# Patient Record
Sex: Male | Born: 1985 | Race: Black or African American | Hispanic: No | Marital: Single
Health system: Midwestern US, Community
[De-identification: ages and names within clinical notes are randomized; demographics above are authoritative.]

## PROBLEM LIST (undated history)

## (undated) HISTORY — PX: HERNIA REPAIR: SHX51

## (undated) HISTORY — PX: OTHER SURGICAL HISTORY: SHX169

---
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
## 2006-02-02 ENCOUNTER — Emergency Department (HOSPITAL_COMMUNITY): Admission: EM | Admit: 2006-02-02 | Discharge: 2006-02-02 | Payer: Self-pay | Admitting: Emergency Medicine

## 2007-01-12 ENCOUNTER — Emergency Department (HOSPITAL_COMMUNITY): Admission: EM | Admit: 2007-01-12 | Discharge: 2007-01-12 | Payer: Self-pay | Admitting: Emergency Medicine
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
## 2010-06-10 NOTE — Op Note (Signed)
Casey Andrews, Casey Andrews                 ACCOUNT NO.:  [id number]   MEDICAL RECORD NO.:  [id number]          PATIENT TYPE:  AMB   LOCATION:  DSC                          FACILITY:  [facility]   PHYSICIAN:  Prabhakar D. Pendse, M.D.DATE OF BIRTH:  [birth year]   DATE OF PROCEDURE:  04/12/2004  DATE OF DISCHARGE:                                 OPERATIVE REPORT   PREOPERATIVE DIAGNOSIS:  Right inguinal scrotal hernia, incarcerated.   POSTOPERATIVE DIAGNOSIS:  Right inguinal scrotal hernia, incarcerated.   OPERATION PERFORMED:  Repair of large, incarcerated, right inguinal scrotal  hernia.   SURGEON:  Prabhakar D. Levie Heritage, M.D.   ASSISTANT:  Leonia Corona, M.D.   ANESTHESIA:  __________   OPERATIVE PROCEDURE:  Under satisfactory general anesthesia, the patient in  supine position, abdomen and groin regions were totally prepped and draped  in the usual manner about 4 inches long.  Transverse was made in the distal  inguinal skin crease.  The skin and subcutaneous tissue incised, bleeders  individually clamped, cut, and electrocoagulated.  External ring identified,  external oblique opened.  The hernia was reduced by gentle manipulation  satisfactorily.  A reduction was accomplished.  Now a Penrose drain was  placed the spermatic cord structures and blunt and sharp dissection was  carried out to isolate the indirect inguinal hernia sac.  Large hernia sac  was now isolated up to its high point, doubly suture ligated with 2-0 silk  and excess of the sac was excised.  The testicle returned to the right  scrotal pouch.  Hernia repair was carried out in the following manner.  The  floor of the inguinal canal was repaired with 2-0 Ethibond interrupted  sutures.  Spermatic cord was placed over this repair and the external  aponeurosis was repaired with #32 wire interrupted sutures.  Satisfactory  repair was accomplished, 0.25% Marcaine with epinephrine was injected  locally for postoperative  analgesia.  Subcutaneous tissue closed with  3-0 Vicryl.  The skin closed with 4-0 Monocryl subcuticular sutures.  Steri-  Strips applied.  Appropriate dressing applied.  Throughout the procedure,  the patient's vital signs remained stable.  The patient withstood the  procedure well and was transferred to recovery room in satisfactory general  condition.      PDP/MEDQ  D:  04/12/2004  T:  04/12/2004  Job:  [id number]   cc:   Sherilyn Dacosta. Chinita Greenland, M.D.  [address]  [place]  [place] [address]  Fax: [phone]   [facility] Dept.

## 2012-04-27 ENCOUNTER — Inpatient Hospital Stay (HOSPITAL_COMMUNITY): Admission: EM | Admit: 2012-04-27 | Payer: Self-pay | Admitting: Family Medicine

## 2012-04-27 ENCOUNTER — Other Ambulatory Visit (HOSPITAL_COMMUNITY): Payer: Self-pay | Admitting: Family Medicine

## 2012-04-27 ENCOUNTER — Ambulatory Visit (HOSPITAL_COMMUNITY)
Admission: RE | Admit: 2012-04-27 | Discharge: 2012-04-27 | Disposition: A | Discharge status: Home / Self Care | Payer: PRIVATE HEALTH INSURANCE | Source: Ambulatory Visit | Attending: Family Medicine | Admitting: Family Medicine

## 2012-04-27 ENCOUNTER — Ambulatory Visit (HOSPITAL_COMMUNITY)
Admission: EM | Admit: 2012-04-27 | Discharge: 2012-04-27 | Disposition: A | Discharge status: Home / Self Care | Payer: PRIVATE HEALTH INSURANCE | Attending: Family Medicine | Admitting: Family Medicine

## 2012-04-27 DIAGNOSIS — N5082 Scrotal pain: Secondary | ICD-10-CM

## 2012-04-27 DIAGNOSIS — IMO0002 Reserved for concepts with insufficient information to code with codable children: Secondary | ICD-10-CM

## 2012-04-27 DIAGNOSIS — N509 Disorder of male genital organs, unspecified: Secondary | ICD-10-CM | POA: Diagnosis present

## 2012-04-27 DIAGNOSIS — N5089 Other specified disorders of the male genital organs: Secondary | ICD-10-CM | POA: Diagnosis not present

## 2012-04-28 ENCOUNTER — Inpatient Hospital Stay (HOSPITAL_BASED_OUTPATIENT_CLINIC_OR_DEPARTMENT_OTHER): Admission: RE | Admit: 2012-04-28 | Source: Ambulatory Visit

## 2012-04-29 ENCOUNTER — Ambulatory Visit (HOSPITAL_COMMUNITY): Admission: RE | Admit: 2012-04-29 | Source: Ambulatory Visit

## 2012-04-29 ENCOUNTER — Inpatient Hospital Stay (HOSPITAL_COMMUNITY): Admission: RE | Admit: 2012-04-29 | Source: Ambulatory Visit

## 2012-11-29 ENCOUNTER — Emergency Department (HOSPITAL_COMMUNITY)
Admission: EM | Admit: 2012-11-29 | Discharge: 2012-11-29 | Disposition: A | Discharge status: Home / Self Care | Attending: Emergency Medicine | Admitting: Emergency Medicine

## 2012-11-29 ENCOUNTER — Encounter (HOSPITAL_COMMUNITY): Payer: Self-pay | Admitting: Emergency Medicine

## 2012-11-29 ENCOUNTER — Emergency Department (HOSPITAL_COMMUNITY)
Admission: EM | Admit: 2012-11-29 | Discharge: 2012-11-29 | Discharge status: Against Medical Advice | Attending: Emergency Medicine | Admitting: Emergency Medicine

## 2012-11-29 DIAGNOSIS — F17200 Nicotine dependence, unspecified, uncomplicated: Secondary | ICD-10-CM | POA: Insufficient documentation

## 2012-11-29 DIAGNOSIS — IMO0002 Reserved for concepts with insufficient information to code with codable children: Secondary | ICD-10-CM | POA: Insufficient documentation

## 2012-11-29 DIAGNOSIS — S01501A Unspecified open wound of lip, initial encounter: Secondary | ICD-10-CM | POA: Insufficient documentation

## 2012-11-29 DIAGNOSIS — Y929 Unspecified place or not applicable: Secondary | ICD-10-CM | POA: Insufficient documentation

## 2012-11-29 DIAGNOSIS — Y9389 Activity, other specified: Secondary | ICD-10-CM | POA: Insufficient documentation

## 2012-11-29 DIAGNOSIS — S01511A Laceration without foreign body of lip, initial encounter: Secondary | ICD-10-CM

## 2012-11-29 NOTE — ED Provider Notes (Signed)
Medical screening examination/treatment/procedure(s) were performed by non-physician practitioner and as supervising physician I was immediately available for consultation/collaboration.  EKG Interpretation   None         Megan E Docherty, MD 11/29/12 2229 
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
## 2012-11-29 NOTE — ED Notes (Signed)
Patient states he just got a call that he had to go get his kid at school and that he would come back later.

## 2012-11-29 NOTE — ED Provider Notes (Signed)
Medical screening examination/treatment/procedure(s) were performed by non-physician practitioner and as supervising physician I was immediately available for consultation/collaboration.  EKG Interpretation   None         Terria Deschepper E Jessika Rothery, MD 11/29/12 2229 

## 2012-11-29 NOTE — ED Notes (Signed)
Pt presents to [facility] with c/o laceration to left lower lip. States a rock flew up and hit him in the lip while landscaping today. Pt ambulatory to room in NAD at this time.
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
## 2014-04-18 IMAGING — US US SCROTUM
1 series · 13 of 25 positions shown · non-contrast
Comparison: 10/17/2005.

CLINICAL DATA: Intermittent right testicular pain.  History of
right inguinal hernia repair in 1995.  Right scrotal swelling for 3
days.

SCROTAL ULTRASOUND
DOPPLER ULTRASOUND OF THE TESTICLES
TECHNIQUE: Complete ultrasound examination of the testicles,
epididymis, and other scrotal structures was performed.  Color and
spectral Doppler ultrasound were also utilized to evaluate blood
flow to the testicles.

[Series 1: us scrotum · 13 of 32 slices shown]
[im 1/32]
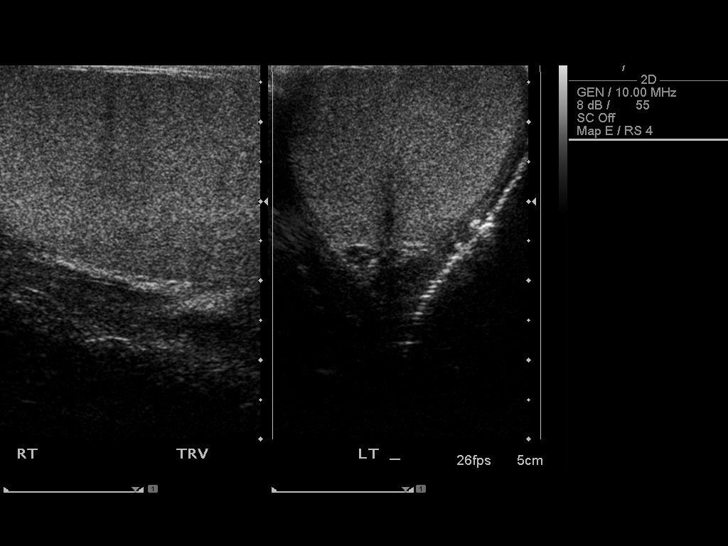
[im 3/32]
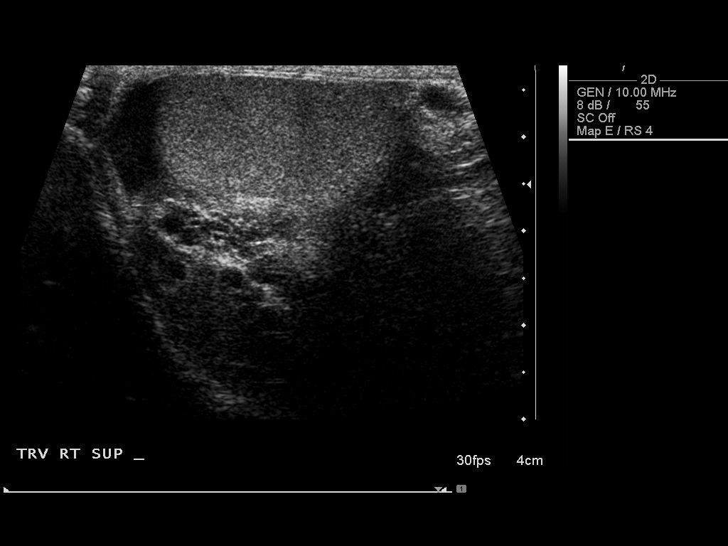
[im 6/32]
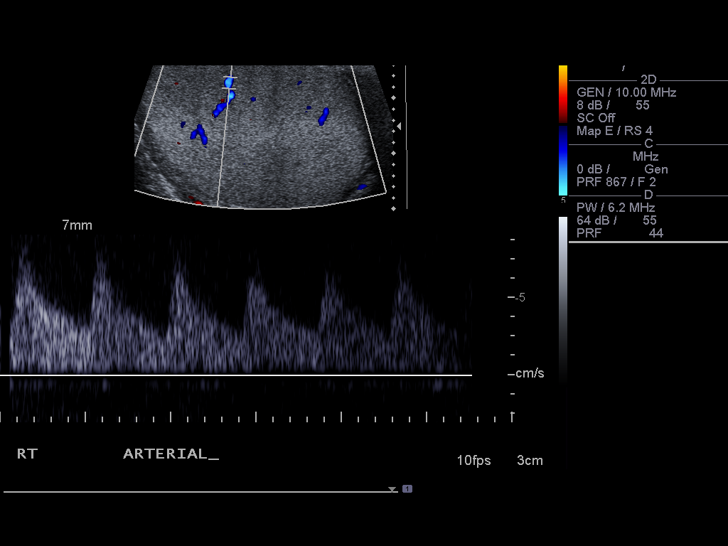
[im 8/32]
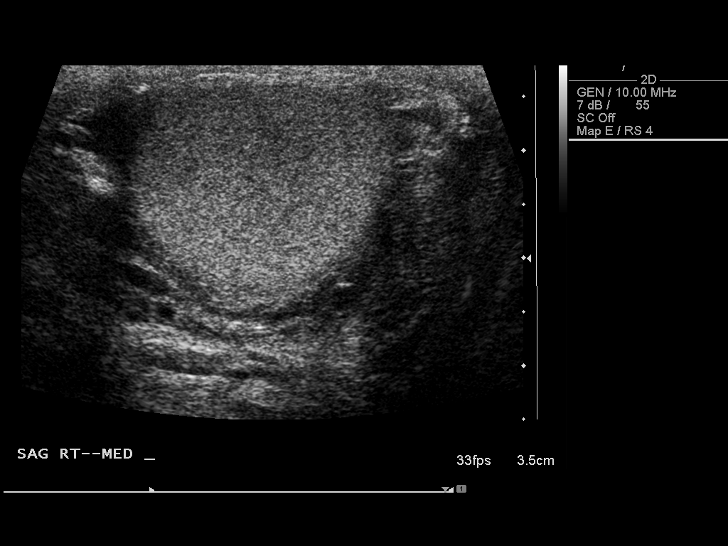
[im 11/32]
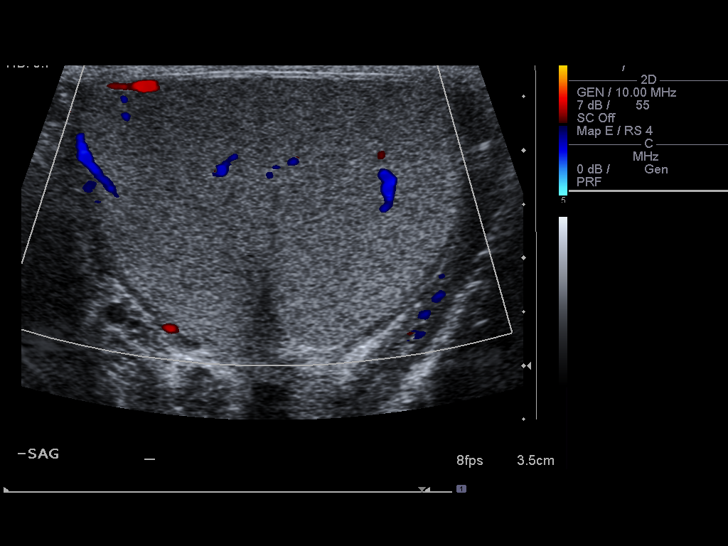
[im 13/32]
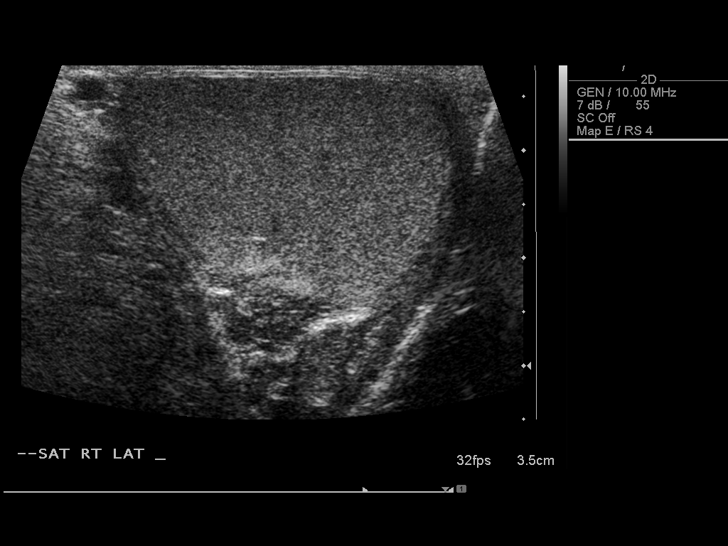
[im 16/32]
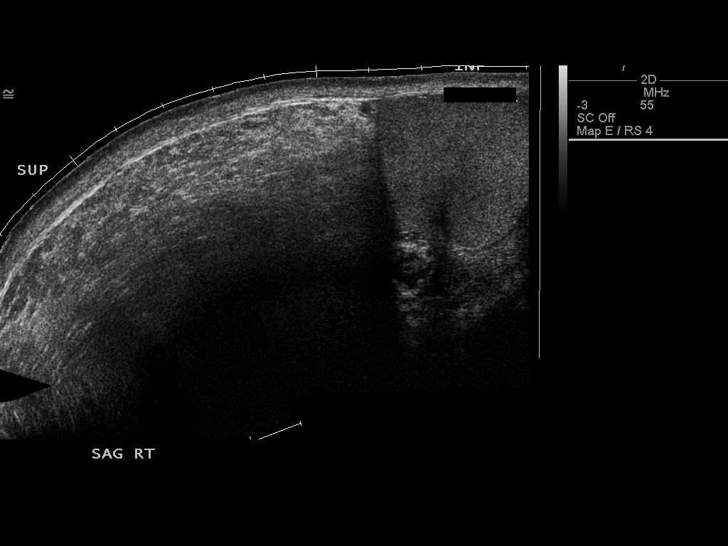
[im 19/32]
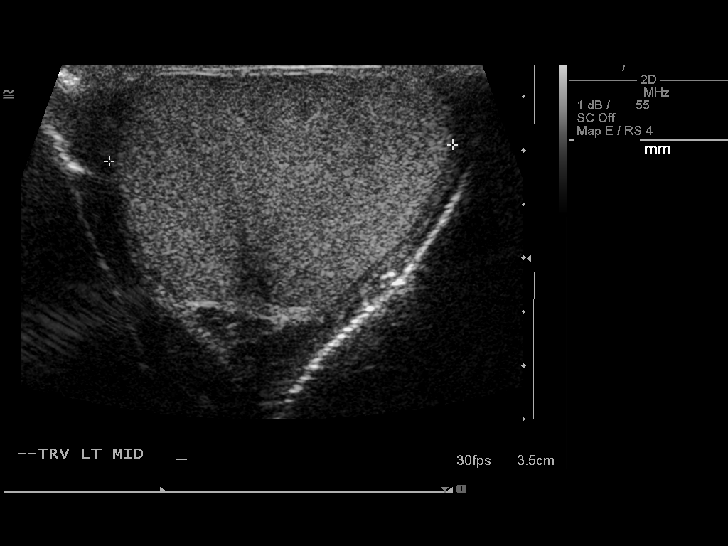
[im 21/32]
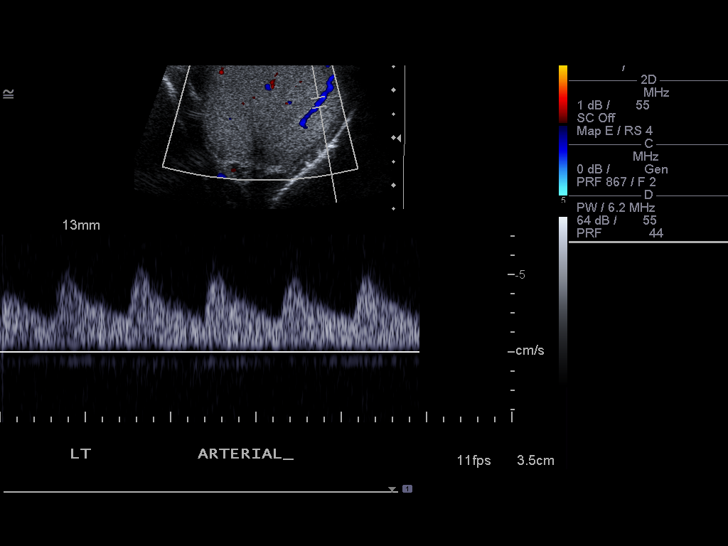
[im 24/32]
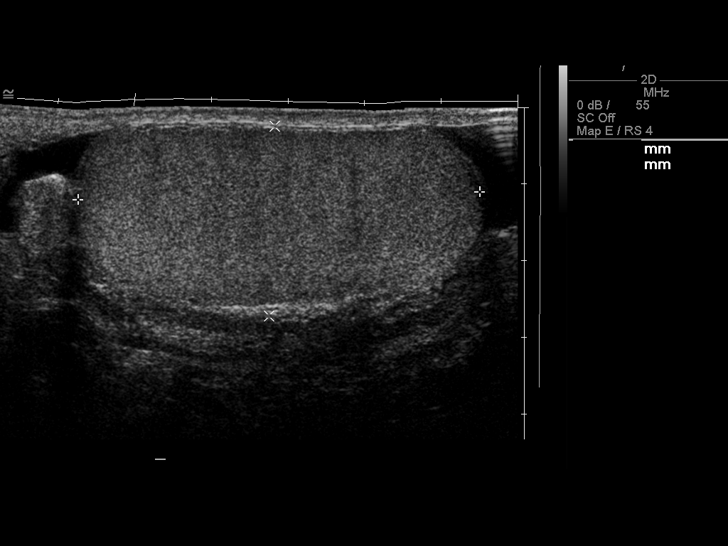
[im 26/32]
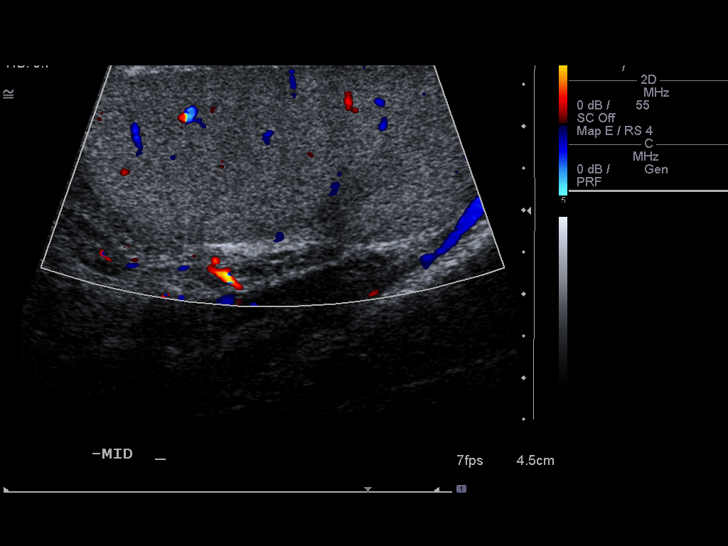
[im 29/32]
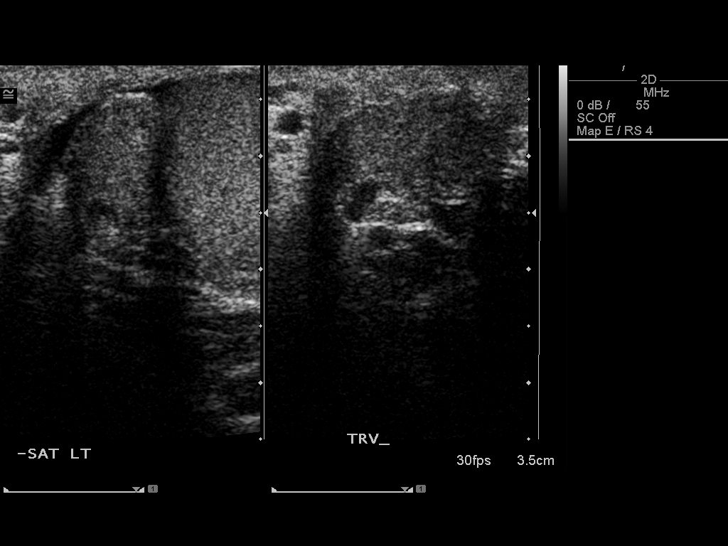
[im 32/32]
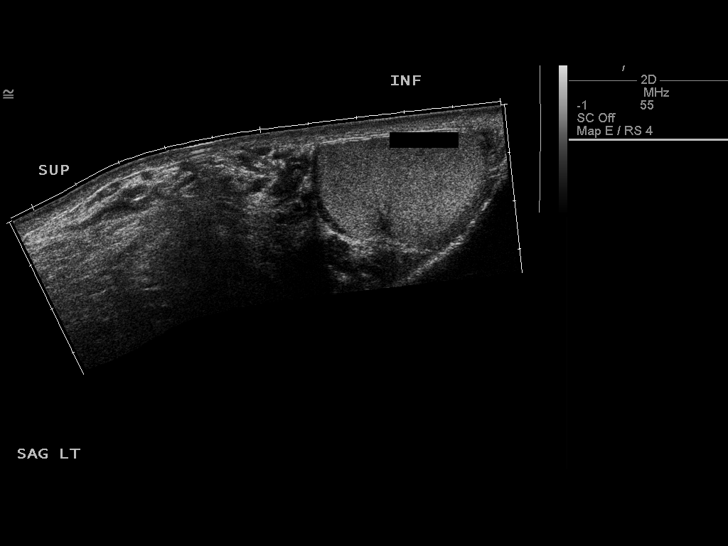

[13 of 25 positions shown; findings below may reference images not displayed]

FINDINGS: Right testis:  The right testis measures 3.6 x 2.5 x 3.2 cm.
Normal parenchymal echotexture.  No focal mass lesions
demonstrated.  Homogeneous normal flow demonstrated on color flow
Doppler imaging.

Left testis:  The left testis measures 5.2 x 2.5 x 3.2 cm.  Normal
parenchymal echotexture.  No focal mass lesions demonstrated.
Homogeneous normal flow demonstrated on color flow Doppler imaging.

Right epididymis:  The right epididymis is normal in size and
appearance.  Corresponding to the area of scrotal swelling superior
to the right testis, there is heterogeneous echogenic material
without obvious peristalsis.  Differential diagnosis would include
recurrent hernia versus postoperative change.  No discrete abscess
or fluid collection is demonstrated.

Left epididymis:  Normal in size and appearance. Soft tissues
superior to the left epididymis are unremarkable.

Hydrocele:  No scrotal hydroceles are demonstrated.

Varicocele:  No scrotal varicoceles are demonstrated.

Pulsed Doppler interrogation of both testes demonstrates low
resistance flow bilaterally.
IMPRESSION: There is heterogeneous mixed echogenic material demonstrated in the
region of the inguinal canal above the right epididymis.  This is
nonspecific and could represent recurrent hernia or postoperative
change.  The testes and epididymides are otherwise normal.  No
evidence of torsion or testicular mass.

## 2014-04-21 ENCOUNTER — Emergency Department (HOSPITAL_COMMUNITY): Payer: Self-pay

## 2014-04-21 ENCOUNTER — Emergency Department (HOSPITAL_COMMUNITY): Payer: Self-pay | Admitting: Certified Registered Nurse Anesthetist

## 2014-04-21 ENCOUNTER — Observation Stay (HOSPITAL_COMMUNITY)
Admission: EM | Admit: 2014-04-21 | Discharge: 2014-04-22 | Disposition: A | Discharge status: Home / Self Care | Payer: Self-pay | Attending: General Surgery | Admitting: General Surgery

## 2014-04-21 ENCOUNTER — Encounter (HOSPITAL_COMMUNITY)
Admission: EM | Disposition: A | Discharge status: Home / Self Care | Payer: Self-pay | Source: Home / Self Care | Attending: Emergency Medicine

## 2014-04-21 ENCOUNTER — Encounter (HOSPITAL_COMMUNITY): Payer: Self-pay | Admitting: *Deleted

## 2014-04-21 ENCOUNTER — Emergency Department (HOSPITAL_COMMUNITY): Admitting: Certified Registered Nurse Anesthetist

## 2014-04-21 DIAGNOSIS — N50811 Right testicular pain: Secondary | ICD-10-CM

## 2014-04-21 DIAGNOSIS — K4030 Unilateral inguinal hernia, with obstruction, without gangrene, not specified as recurrent: Secondary | ICD-10-CM

## 2014-04-21 DIAGNOSIS — E669 Obesity, unspecified: Secondary | ICD-10-CM | POA: Insufficient documentation

## 2014-04-21 DIAGNOSIS — K4031 Unilateral inguinal hernia, with obstruction, without gangrene, recurrent: Principal | ICD-10-CM | POA: Insufficient documentation

## 2014-04-21 DIAGNOSIS — F17210 Nicotine dependence, cigarettes, uncomplicated: Secondary | ICD-10-CM | POA: Insufficient documentation

## 2014-04-21 DIAGNOSIS — Z6831 Body mass index (BMI) 31.0-31.9, adult: Secondary | ICD-10-CM | POA: Insufficient documentation

## 2014-04-21 HISTORY — PX: INSERTION OF MESH: SHX5868

## 2014-04-21 HISTORY — PX: INGUINAL HERNIA REPAIR: SHX194

## 2014-04-21 LAB — BASIC METABOLIC PANEL
Anion gap: 10 (ref 5–15)
BUN: 18 mg/dL (ref 6–23)
CALCIUM: 8.6 mg/dL (ref 8.4–10.5)
CHLORIDE: 107 mmol/L (ref 96–112)
CO2: 24 mmol/L (ref 19–32)
Creatinine, Ser: 0.69 mg/dL (ref 0.50–1.35)
GFR calc Af Amer: >90 mL/min (ref >90)
GFR calc non Af Amer: >90 mL/min (ref >90)
GLUCOSE: 103 mg/dL — AB (ref 70–99)
Potassium: 3.4 mmol/L — ABNORMAL LOW (ref 3.5–5.1)
Sodium: 141 mmol/L (ref 135–145)

## 2014-04-21 LAB — CBC WITH DIFFERENTIAL/PLATELET
Basophils Absolute: 0 10*3/uL (ref 0.0–0.1)
Basophils Relative: 1 % (ref 0–1)
EOS ABS: 0 10*3/uL (ref 0.0–0.7)
EOS PCT: 0 % (ref 0–5)
HEMATOCRIT: 43.3 % (ref 39.0–52.0)
HEMOGLOBIN: 15.1 g/dL (ref 13.0–17.0)
LYMPHS ABS: 1.4 10*3/uL (ref 0.7–4.0)
Lymphocytes Relative: 24 % (ref 12–46)
MCH: 31.1 pg (ref 26.0–34.0)
MCHC: 34.9 g/dL (ref 30.0–36.0)
MCV: 89.3 fL (ref 78.0–100.0)
MONO ABS: 0.6 10*3/uL (ref 0.1–1.0)
Monocytes Relative: 10 % (ref 3–12)
NEUTROS PCT: 65 % (ref 43–77)
Neutro Abs: 3.9 10*3/uL (ref 1.7–7.7)
Platelets: 155 10*3/uL (ref 150–400)
RBC: 4.85 MIL/uL (ref 4.22–5.81)
RDW: 13 % (ref 11.5–15.5)
WBC: 6 10*3/uL (ref 4.0–10.5)

## 2014-04-21 SURGERY — REPAIR, HERNIA, INGUINAL, INCARCERATED
Anesthesia: General | Site: Groin | Laterality: Right

## 2014-04-21 MED ORDER — LIDOCAINE HCL (CARDIAC) 20 MG/ML IV SOLN
INTRAVENOUS | Status: AC
  Filled 2014-04-21: qty 5

## 2014-04-21 MED ORDER — NEOSTIGMINE METHYLSULFATE 10 MG/10ML IV SOLN
INTRAVENOUS | Status: AC
  Filled 2014-04-21: qty 1

## 2014-04-21 MED ORDER — BUPIVACAINE-EPINEPHRINE 0.25% -1:200000 IJ SOLN
INTRAMUSCULAR | Status: AC
  Filled 2014-04-21: qty 1

## 2014-04-21 MED ORDER — CEFAZOLIN SODIUM-DEXTROSE 2-3 GM-% IV SOLR
INTRAVENOUS | Status: AC
  Filled 2014-04-21: qty 50

## 2014-04-21 MED ORDER — ONDANSETRON HCL 4 MG/2ML IJ SOLN
4.0000 mg | Freq: Once | INTRAMUSCULAR | Status: AC
  Administered 2014-04-21: 4 mg via INTRAVENOUS
  Filled 2014-04-21: qty 2

## 2014-04-21 MED ORDER — MIDAZOLAM HCL 2 MG/2ML IJ SOLN
INTRAMUSCULAR | Status: AC
  Filled 2014-04-21: qty 2

## 2014-04-21 MED ORDER — HYDROMORPHONE HCL 1 MG/ML IJ SOLN
1.0000 mg | Freq: Once | INTRAMUSCULAR | Status: AC
  Administered 2014-04-21: 1 mg via INTRAVENOUS
  Filled 2014-04-21: qty 1

## 2014-04-21 MED ORDER — FENTANYL CITRATE 0.05 MG/ML IJ SOLN
INTRAMUSCULAR | Status: DC | PRN
  Administered 2014-04-21 (×3): 50 ug via INTRAVENOUS
  Administered 2014-04-21: 100 ug via INTRAVENOUS
  Administered 2014-04-21: 50 ug via INTRAVENOUS

## 2014-04-21 MED ORDER — ONDANSETRON HCL 4 MG/2ML IJ SOLN
INTRAMUSCULAR | Status: AC
  Filled 2014-04-21: qty 2

## 2014-04-21 MED ORDER — OXYCODONE HCL 5 MG PO TABS: 5.0000 mg | ORAL_TABLET | ORAL | Status: DC | PRN

## 2014-04-21 MED ORDER — ALBUTEROL SULFATE HFA 108 (90 BASE) MCG/ACT IN AERS
INHALATION_SPRAY | RESPIRATORY_TRACT | Status: AC
  Filled 2014-04-21: qty 6.7

## 2014-04-21 MED ORDER — ONDANSETRON HCL 4 MG/2ML IJ SOLN
INTRAMUSCULAR | Status: DC | PRN
  Administered 2014-04-21: 4 mg via INTRAVENOUS

## 2014-04-21 MED ORDER — ENOXAPARIN SODIUM 40 MG/0.4ML ~~LOC~~ SOLN
40.0000 mg | SUBCUTANEOUS | Status: DC
  Administered 2014-04-22: 40 mg via SUBCUTANEOUS
  Filled 2014-04-21 (×2): qty 0.4

## 2014-04-21 MED ORDER — METOCLOPRAMIDE HCL 5 MG/ML IJ SOLN
INTRAMUSCULAR | Status: AC
  Filled 2014-04-21: qty 2

## 2014-04-21 MED ORDER — 0.9 % SODIUM CHLORIDE (POUR BTL) OPTIME
TOPICAL | Status: DC | PRN
  Administered 2014-04-21: 1000 mL

## 2014-04-21 MED ORDER — MORPHINE SULFATE 2 MG/ML IJ SOLN
2.0000 mg | INTRAMUSCULAR | Status: DC | PRN
  Administered 2014-04-21: 4 mg via INTRAVENOUS
  Administered 2014-04-22: 2 mg via INTRAVENOUS
  Administered 2014-04-22 (×2): 4 mg via INTRAVENOUS
  Filled 2014-04-21: qty 2
  Filled 2014-04-21: qty 1
  Filled 2014-04-21 (×2): qty 2

## 2014-04-21 MED ORDER — NEOSTIGMINE METHYLSULFATE 10 MG/10ML IV SOLN
INTRAVENOUS | Status: DC | PRN
  Administered 2014-04-21: 4 mg via INTRAVENOUS

## 2014-04-21 MED ORDER — OXYCODONE-ACETAMINOPHEN 5-325 MG PO TABS
1.0000 | ORAL_TABLET | ORAL | Status: DC | PRN
  Administered 2014-04-21 – 2014-04-22 (×2): 2 via ORAL
  Filled 2014-04-21 (×2): qty 2

## 2014-04-21 MED ORDER — FENTANYL CITRATE 0.05 MG/ML IJ SOLN
INTRAMUSCULAR | Status: AC
  Filled 2014-04-21: qty 5

## 2014-04-21 MED ORDER — MEPERIDINE HCL 50 MG/ML IJ SOLN: 6.2500 mg | INTRAMUSCULAR | Status: DC | PRN

## 2014-04-21 MED ORDER — LIDOCAINE HCL (CARDIAC) 20 MG/ML IV SOLN
INTRAVENOUS | Status: DC | PRN
  Administered 2014-04-21: 50 mg via INTRAVENOUS

## 2014-04-21 MED ORDER — DEXAMETHASONE SODIUM PHOSPHATE 10 MG/ML IJ SOLN
INTRAMUSCULAR | Status: AC
  Filled 2014-04-21: qty 1

## 2014-04-21 MED ORDER — HYDROMORPHONE HCL 1 MG/ML IJ SOLN: 0.2500 mg | INTRAMUSCULAR | Status: DC | PRN

## 2014-04-21 MED ORDER — ACETAMINOPHEN 650 MG RE SUPP: 650.0000 mg | Freq: Four times a day (QID) | RECTAL | Status: DC | PRN

## 2014-04-21 MED ORDER — HYDROMORPHONE HCL 1 MG/ML IJ SOLN
1.0000 mg | Freq: Once | INTRAMUSCULAR | Status: AC
  Administered 2014-04-21: 1 mg via INTRAMUSCULAR
  Filled 2014-04-21: qty 1

## 2014-04-21 MED ORDER — HYDROCODONE-ACETAMINOPHEN 5-325 MG PO TABS: 1.0000 | ORAL_TABLET | ORAL | Status: DC | PRN

## 2014-04-21 MED ORDER — MIDAZOLAM HCL 5 MG/5ML IJ SOLN
INTRAMUSCULAR | Status: DC | PRN
  Administered 2014-04-21: 2 mg via INTRAVENOUS

## 2014-04-21 MED ORDER — ROCURONIUM BROMIDE 100 MG/10ML IV SOLN
INTRAVENOUS | Status: DC | PRN
Start: 2014-04-21 — End: 2014-04-21
  Administered 2014-04-21: 40 mg via INTRAVENOUS

## 2014-04-21 MED ORDER — PROPOFOL 10 MG/ML IV BOLUS
INTRAVENOUS | Status: AC
  Filled 2014-04-21: qty 20

## 2014-04-21 MED ORDER — NICOTINE 14 MG/24HR TD PT24
14.0000 mg | MEDICATED_PATCH | Freq: Every day | TRANSDERMAL | Status: DC
  Administered 2014-04-21 – 2014-04-22 (×2): 14 mg via TRANSDERMAL
  Filled 2014-04-21 (×2): qty 1

## 2014-04-21 MED ORDER — CEFAZOLIN SODIUM-DEXTROSE 2-3 GM-% IV SOLR
2.0000 g | INTRAVENOUS | Status: AC
  Administered 2014-04-21: 2 g via INTRAVENOUS

## 2014-04-21 MED ORDER — LACTATED RINGERS IV SOLN
INTRAVENOUS | Status: DC
  Administered 2014-04-21 (×2): via INTRAVENOUS

## 2014-04-21 MED ORDER — DEXAMETHASONE SODIUM PHOSPHATE 4 MG/ML IJ SOLN
INTRAMUSCULAR | Status: DC | PRN
  Administered 2014-04-21: 10 mg via INTRAVENOUS

## 2014-04-21 MED ORDER — GLYCOPYRROLATE 0.2 MG/ML IJ SOLN
INTRAMUSCULAR | Status: AC
  Filled 2014-04-21: qty 4

## 2014-04-21 MED ORDER — MIDAZOLAM HCL 2 MG/2ML IJ SOLN: 0.5000 mg | Freq: Once | INTRAMUSCULAR | Status: DC | PRN

## 2014-04-21 MED ORDER — ROCURONIUM BROMIDE 100 MG/10ML IV SOLN
INTRAVENOUS | Status: AC
  Filled 2014-04-21: qty 1

## 2014-04-21 MED ORDER — GLYCOPYRROLATE 0.2 MG/ML IJ SOLN
INTRAMUSCULAR | Status: DC | PRN
  Administered 2014-04-21: 0.4 mg via INTRAVENOUS

## 2014-04-21 MED ORDER — ALBUTEROL SULFATE HFA 108 (90 BASE) MCG/ACT IN AERS
INHALATION_SPRAY | RESPIRATORY_TRACT | Status: DC | PRN
  Administered 2014-04-21: 5 via RESPIRATORY_TRACT

## 2014-04-21 MED ORDER — METOCLOPRAMIDE HCL 5 MG/ML IJ SOLN
INTRAMUSCULAR | Status: DC | PRN
  Administered 2014-04-21: 10 mg via INTRAVENOUS

## 2014-04-21 MED ORDER — ACETAMINOPHEN 325 MG PO TABS: 650.0000 mg | ORAL_TABLET | Freq: Four times a day (QID) | ORAL | Status: DC | PRN

## 2014-04-21 MED ORDER — ONDANSETRON HCL 4 MG/2ML IJ SOLN: 4.0000 mg | Freq: Four times a day (QID) | INTRAMUSCULAR | Status: DC | PRN

## 2014-04-21 MED ORDER — BUPIVACAINE-EPINEPHRINE 0.25% -1:200000 IJ SOLN
INTRAMUSCULAR | Status: DC | PRN
  Administered 2014-04-21: 20 mL

## 2014-04-21 MED ORDER — PROPOFOL 10 MG/ML IV BOLUS
INTRAVENOUS | Status: DC | PRN
  Administered 2014-04-21: 200 mg via INTRAVENOUS
  Administered 2014-04-21: 100 mg via INTRAVENOUS

## 2014-04-21 MED ORDER — PROMETHAZINE HCL 25 MG/ML IJ SOLN: 6.2500 mg | INTRAMUSCULAR | Status: DC | PRN

## 2014-04-21 SURGICAL SUPPLY — 25 items
BENZOIN TINCTURE PRP APPL 2/3 (GAUZE/BANDAGES/DRESSINGS) IMPLANT
BLADE HEX COATED 2.75 (ELECTRODE) ×3 IMPLANT
CLOSURE WOUND 1/2 X4 (GAUZE/BANDAGES/DRESSINGS)
DECANTER SPIKE VIAL GLASS SM (MISCELLANEOUS) IMPLANT
DRAPE LAPAROSCOPIC ABDOMINAL (DRAPES) ×3 IMPLANT
ELECT REM PT RETURN 9FT ADLT (ELECTROSURGICAL) ×3
ELECTRODE REM PT RTRN 9FT ADLT (ELECTROSURGICAL) ×1 IMPLANT
GAUZE SPONGE 4X4 12PLY STRL (GAUZE/BANDAGES/DRESSINGS) IMPLANT
GLOVE BIO SURGEON STRL SZ 6.5 (GLOVE) ×2 IMPLANT
GLOVE BIO SURGEON STRL SZ7 (GLOVE) ×6 IMPLANT
GLOVE BIO SURGEONS STRL SZ 6.5 (GLOVE) ×1
KIT BASIN OR (CUSTOM PROCEDURE TRAY) ×3 IMPLANT
LIQUID BAND (GAUZE/BANDAGES/DRESSINGS) ×3 IMPLANT
MESH HERNIA 3X6 (Mesh General) ×3 IMPLANT
NEEDLE HYPO 22GX1.5 SAFETY (NEEDLE) IMPLANT
NS IRRIG 1000ML POUR BTL (IV SOLUTION) ×3 IMPLANT
PACK GENERAL/GYN (CUSTOM PROCEDURE TRAY) ×3 IMPLANT
STAPLER VISISTAT 35W (STAPLE) IMPLANT
STRIP CLOSURE SKIN 1/2X4 (GAUZE/BANDAGES/DRESSINGS) IMPLANT
SUT MNCRL AB 4-0 PS2 18 (SUTURE) IMPLANT
SUT PROLENE 0 SH 30 (SUTURE) ×15 IMPLANT
SUT VIC AB 3-0 SH 18 (SUTURE) ×3 IMPLANT
SUT VIC AB 4-0 PS2 27 (SUTURE) ×3 IMPLANT
SYR CONTROL 10ML LL (SYRINGE) IMPLANT
TOWEL OR 17X26 10 PK STRL BLUE (TOWEL DISPOSABLE) ×3 IMPLANT

## 2014-04-21 NOTE — H&P (Signed)
Chief Complaint: right inguinal pain HPI: Casey Andrews is a healthy 29 year old male with a history of right inguinal hernia repair in 2006 by Dr. Leeanne MannanFarooqui presenting with sudden onset right inguinal/scrotal pain.  Onset was sudden this morning after he picked up concrete.  Time pattern is constant.  Severe in severity.  Characterized as sharp and shooting pain. No modifying factors.  Aggravating by lying down.  No alleviating factors.  Associated with chills and sweats.  Denies fevers.  Denies nausea and vomiting today.  Endorses vomiting last night secondary to a "cold."  He had a bowel movement this morning.  A US shows a bowel containing right inguinal hernia.  Normal white count.  A BMP is pending.  We have been asked to evaluate for a hernia repair.    History reviewed. No pertinent past medical history.  Past Surgical History  Procedure Laterality Date  . Hernia repair      2008    History reviewed. No pertinent family history. Social History:  reports that he has been smoking Cigarettes.  He has a 3 pack-year smoking history. He does not have any smokeless tobacco history on file. He reports that he does not drink alcohol or use illicit drugs.  Allergies: No Known Allergies   (Not in a hospital admission)  Results for orders placed or performed during the hospital encounter of 04/21/14 (from the past 48 hour(s))  CBC with Differential/Platelet     Status: None   Collection Time: 04/21/14  3:05 PM  Result Value Ref Range   WBC 6.0 4.0 - 10.5 K/uL   RBC 4.85 4.22 - 5.81 MIL/uL   Hemoglobin 15.1 13.0 - 17.0 g/dL   HCT 16.143.3 09.639.0 - 04.552.0 %   MCV 89.3 78.0 - 100.0 fL   MCH 31.1 26.0 - 34.0 pg   MCHC 34.9 30.0 - 36.0 g/dL   RDW 40.913.0 81.111.5 - 91.415.5 %   Platelets 155 150 - 400 K/uL   Neutrophils Relative % 65 43 - 77 %   Neutro Abs 3.9 1.7 - 7.7 K/uL   Lymphocytes Relative 24 12 - 46 %   Lymphs Abs 1.4 0.7 - 4.0 K/uL   Monocytes Relative 10 3 - 12 %   Monocytes Absolute 0.6 0.1 -  1.0 K/uL   Eosinophils Relative 0 0 - 5 %   Eosinophils Absolute 0.0 0.0 - 0.7 K/uL   Basophils Relative 1 0 - 1 %   Basophils Absolute 0.0 0.0 - 0.1 K/uL   [place] Scrotum  04/21/2014   CLINICAL DATA:  29 year old male with right testicular pain and swelling. Personal history of right hernia repair. Initial encounter.  EXAM: SCROTAL ULTRASOUND  DOPPLER ULTRASOUND OF THE TESTICLES  TECHNIQUE: Complete ultrasound examination of the testicles, epididymis, and other scrotal structures was performed. Color and spectral Doppler ultrasound were also utilized to evaluate blood flow to the testicles.  COMPARISON:  04/27/2012.  FINDINGS: Right testicle  Measurements: 4.8 x 2.8 x 3.8 cm. No mass or microlithiasis visualized.  Left testicle  Measurements: 5.0 x 2.8 x 3.0 cm. No mass or microlithiasis visualized.  Right epididymis:  Normal in size and appearance.  Left epididymis:  Normal in size and appearance.  Hydrocele:  None visualized.  Varicocele:  None visualized.  Pulsed Doppler interrogation of both testes demonstrates normal low resistance arterial and venous waveforms bilaterally.  Other findings: Asymmetric soft tissue in the right inguinal canal displacing the right testicle inferiorly, similar to that depicted in 2014.  This has gut signature on cine images (image 8 series 2 and image 7 series 1).  IMPRESSION: 1. Evidence of bowel containing right inguinal hernia. 2. Otherwise negative scrotal ultrasound.  No testicular torsion.   Electronically Signed   By: Odessa Fleming M.D.   On: 04/21/2014 14:33   [place] Art/ven Flow Abd Pelv Doppler  04/21/2014   CLINICAL DATA:  29 year old male with right testicular pain and swelling. Personal history of right hernia repair. Initial encounter.  EXAM: SCROTAL ULTRASOUND  DOPPLER ULTRASOUND OF THE TESTICLES  TECHNIQUE: Complete ultrasound examination of the testicles, epididymis, and other scrotal structures was performed. Color and spectral Doppler ultrasound were also utilized to  evaluate blood flow to the testicles.  COMPARISON:  04/27/2012.  FINDINGS: Right testicle  Measurements: 4.8 x 2.8 x 3.8 cm. No mass or microlithiasis visualized.  Left testicle  Measurements: 5.0 x 2.8 x 3.0 cm. No mass or microlithiasis visualized.  Right epididymis:  Normal in size and appearance.  Left epididymis:  Normal in size and appearance.  Hydrocele:  None visualized.  Varicocele:  None visualized.  Pulsed Doppler interrogation of both testes demonstrates normal low resistance arterial and venous waveforms bilaterally.  Other findings: Asymmetric soft tissue in the right inguinal canal displacing the right testicle inferiorly, similar to that depicted in 2014. This has gut signature on cine images (image 8 series 2 and image 7 series 1).  IMPRESSION: 1. Evidence of bowel containing right inguinal hernia. 2. Otherwise negative scrotal ultrasound.  No testicular torsion.   Electronically Signed   By: Odessa Fleming M.D.   On: 04/21/2014 14:33    Review of Systems  Constitutional: Positive for chills and diaphoresis. Negative for fever, weight loss and malaise/fatigue.  HENT: Negative.   Eyes: Negative.   Respiratory: Negative.   Cardiovascular: Negative.   Gastrointestinal: Negative for constipation.       Endorses intermittent hematochezia.  Genitourinary: Negative.   Musculoskeletal: Negative.   Neurological: Negative.  Negative for weakness.  Psychiatric/Behavioral: Negative.     Blood pressure 149/74, pulse 73, temperature 97.8 F (36.6 C), temperature source Oral, resp. rate 16, SpO2 97 %. Physical Exam  Constitutional: He is oriented to person, place, and time. He appears well-developed and well-nourished. No distress.  Cardiovascular: Normal rate, regular rhythm, normal heart sounds and intact distal pulses.  Exam reveals no gallop and no friction rub.   No murmur heard. Respiratory: Effort normal and breath sounds normal. No respiratory distress. He has no wheezes. He has no rales.  He exhibits no tenderness.  GI: Soft. Bowel sounds are normal. He exhibits no distension. There is no tenderness. There is no rebound and no guarding.  Right inguinal/scrotal hernia, incarcerated, no surrounding erythema.  Unable to reduce and severe pain to palpation   Genitourinary: No penile tenderness.  Right inguinal region pain and swelling.   Musculoskeletal: Normal range of motion. He exhibits no edema or tenderness.  Neurological: He is alert and oriented to person, place, and time.  Skin: Skin is warm and dry. No rash noted. He is not diaphoretic. No erythema. No pallor.  Psychiatric: He has a normal mood and affect. His behavior is normal. Judgment and thought content normal.     Assessment/Plan Incarcerated right inguinal hernia -To OR for repair this evening -risks of surgery discussed including infection, bleeding, damage to surrounding structures, anesthesia risks.  The patient verbalizes understanding and wishes to proceed.  -IVF -pain control -ancef on call to OR -NPO -SCD/lovenox post op -  await renal panel   Dajuan Turnley ANP-BC 04/21/2014, 3:32 PM

## 2014-04-21 NOTE — Progress Notes (Signed)
29 yr [place] her[place] in testicles treated with surgery, pt was picking up concrete today at work and felt a sudden pain in right side of testicles Dx Incarcerated right inguinal hernia -To OR for repair this evening  CM spoke with pt who [place]cp. CM discussed and provided written information for self pay pcps, importance of pcp for f/u care, www.needymeds.org, www.goodrx.com, discounted pharmacies and other [place] resources such as [facility], [facility], affordable care act,  financial assistance, DSS and  health department  Reviewed resources for [place] self pay pcps like [facility], [facility] medicine at [address], [facility], general medical clinics, [facility] plus others, medication resources, [facility] out patient pharmacies and housing Pt voiced understanding and appreciation of resources provided  Placed information in pt belonging bag  Provided [facility] contact information Agreed to [facility] referral CM completed referral   As CM left the room Dr Maisie Fushomas, Surgeon in to visit pt

## 2014-04-21 NOTE — ED Notes (Addendum)
Pt reports hx of hernia in testicles treated with surgery, pt was picking up concrete today at work and felt a sudden pain in right side of testicles. Pain 10/10.

## 2014-04-21 NOTE — Anesthesia Postprocedure Evaluation (Signed)
  Anesthesia Post-op Note  Patient: Casey Andrews  Procedure(s) Performed: Procedure(s): REPAIR OF RIGHT INCARCERATED INGUINAL HERNIA WITH MESH (Right) INSERTION OF MESH (Right)  Patient Location: PACU  Anesthesia Type:General  Level of Consciousness: awake, alert , oriented and patient cooperative  Airway and Oxygen Therapy: Patient Spontanous Breathing and Patient connected to nasal cannula oxygen  Post-op Pain: mild  Post-op Assessment: Post-op Vital signs reviewed, Patient's Cardiovascular Status Stable, Respiratory Function Stable, Patent Airway, No signs of Nausea or vomiting and Pain level controlled  Post-op Vital Signs: Reviewed and stable  Last Vitals:  Filed Vitals:   04/21/14 1930  BP: 151/81  Pulse: 70  Temp:   Resp: 22    Complications: No apparent anesthesia complications

## 2014-04-21 NOTE — Op Note (Addendum)
04/21/2014  6:28 PM  PATIENT:  Casey Andrews  29 y.o. male  Patient Care Team: No Pcp Per Patient as PCP - General (General Practice)  PRE-OPERATIVE DIAGNOSIS:  Recurrent Incarcerated Inguinal Hernia  POST-OPERATIVE DIAGNOSIS:  RECURRENT RIGHT INCARCERATED INGUINAL HERNIA  PROCEDURE:   REPAIR OF RIGHT INCARCERATED INGUINAL HERNIA WITH MESH INSERTION OF MESH   Surgeon(s): Romie LeveeAlicia Jonluke Cobbins, MD Darnell Levelodd Gerkin, MD  ASSISTANT: Gerrit FriendsGerkin   ANESTHESIA:   local and general  EBL:  Total I/O In: 1000 [I.V.:1000] Out: -   DRAINS: none   SPECIMEN:  No Specimen  DISPOSITION OF SPECIMEN:  N/A  COUNTS:  YES  PLAN OF CARE: Admit for overnight observation  PATIENT DISPOSITION:  PACU - hemodynamically stable.  INDICATION: 29 y.o. M with acute onset of pain after lifting a cinder block.  US confirms bowel within hernia.  Patient with a history of right inguinal hernia repair 10 years ago.   OR FINDINGS: incarcerated inguinal hernia  DESCRIPTION: the patient was identified in the preoperative holding area and taken to the OR where they were laid supine on the operating room table.  General anesthesia was induced without difficulty. SCDs were also noted to be in place prior to the initiation of anesthesia.  The patient was then prepped and draped in the usual sterile fashion.   A surgical timeout was performed indicating the correct patient, procedure, positioning and need for preoperative antibiotics.   I began by making an incision through the previous scar.  This was carried down through the subcutaneous tissues using Bovie electrocautery. I divided down to the external oblique aponeurosis. I then openedusing a small neck out laterally with a 15 blade scalpel. I then carefully opened the aponeurosis to the external ring making sure to avoid spermatic cord structures. After this I dissected out the hernia planes. We were able to reduce the hernia partially at this time. I was unable to get a  Penrose drain around the hernia sac and spermatic cord. We then opened up the hernia sac and removed a large portion of the omentum. This was placed back through the internal ring into the abdomen.  After this was completed the hernia sac was closed the abdomen using a pursestring 0 Prolene suture. A portion of the hernia sac was resected. The rest of the hernia sac was placed back into the scrotum. We then brought a 3 x 6 Bard mesh onto the field. This was cut to size and the corner was attached to Require using a 0 Prolene suture. I then used the same suture to run the mesh to the shelving border out laterally. I then tacked the mesh a transversus superiorly using 0 Prolene stitches. We then cut the mesh to allow for entrance of the spermatic cord.  This was then tacked using several 0 Prolene interrupted sutures. I then placed a nerve block at the pubic tubercle and shelving border. After this I closed the external oblique aponeurosis using a running 3-0 Vicryl suture. The Scarpa's fascia was closed using interrupted 3-0 Vicryl sutures. The wound was irrigated and the skin was closed using a running 4-0 Vicryl suture and Dermabond. The patient tolerated the procedure well and was sent to the postanesthesia care unit in stable condition. All counts were correct per operating room staff.

## 2014-04-21 NOTE — Anesthesia Procedure Notes (Signed)
Procedure Name: Intubation Date/Time: 04/21/2014 4:58 PM Performed by: Ludwig LeanJONES, Marrell Dicaprio C Pre-anesthesia Checklist: Patient identified, Emergency Drugs available, Suction available and Patient being monitored Patient Re-evaluated:Patient Re-evaluated prior to inductionOxygen Delivery Method: Circle System Utilized Preoxygenation: Pre-oxygenation with 100% oxygen Intubation Type: IV induction, Rapid sequence and Cricoid Pressure applied Laryngoscope Size: Mac and 4 Grade View: Grade I Tube type: Oral Tube size: 8.0 mm Number of attempts: 1 Airway Equipment and Method: Oral airway Placement Confirmation: ETT inserted through vocal cords under direct vision,  positive ETCO2 and breath sounds checked- equal and bilateral Secured at: 24 cm Tube secured with: Tape Dental Injury: Teeth and Oropharynx as per pre-operative assessment

## 2014-04-21 NOTE — ED Provider Notes (Signed)
CSN: [id number]     Arrival date & time 04/21/14  1158 History   First MD Initiated Contact with Patient 04/21/14 1231     Chief Complaint  Patient presents with  . testicle pain      Patient is a 29 y.o. male presenting with testicular pain. The history is provided by the patient.  Testicle Pain This is a new problem. The current episode started 3 to 5 hours ago. The problem occurs constantly. The problem has been rapidly worsening. Associated symptoms include abdominal pain. Pertinent negatives include no chest pain and no shortness of breath. Exacerbated by: movement. Nothing relieves the symptoms. He has tried rest for the symptoms. The treatment provided no relief.  pt reports sudden onset of right testicle pain while lifting concrete at work today He has h/o right sided inguinal hernia that required surgical repair He reports this feels worse than previous hernia He had otherwise been well prior to this morning    PMH - none  Past Surgical History  Procedure Laterality Date  . Hernia repair      2008   History reviewed. No pertinent family history. History  Substance Use Topics  . Smoking status: Current Every Day Smoker -- 0.50 packs/day for 6 years    Types: Cigarettes  . Smokeless tobacco: Not on file  . Alcohol Use: No    Review of Systems  Respiratory: Negative for shortness of breath.   Cardiovascular: Negative for chest pain.  Gastrointestinal: Positive for nausea and abdominal pain. Negative for vomiting.  Genitourinary: Positive for scrotal swelling and testicular pain. Negative for dysuria.  All other systems reviewed and are negative.     Allergies  Review of patient's allergies indicates no known allergies.  Home Medications   Prior to Admission medications   Not on File   BP 122/71 mmHg  Pulse 76  Temp(Src) 97.8 F (36.6 C) (Oral)  Resp 22  SpO2 100% Physical Exam CONSTITUTIONAL: Well developed/well nourished, uncomfortable  appearing HEAD: Normocephalic/atraumatic EYES: EOMI ENMT: Mucous membranes moist NECK: supple no meningeal signs SPINE/BACK:entire spine nontender CV: S1/S2 noted, no murmurs/rubs/gallops noted LUNGS: Lungs are clear to auscultation bilaterally, no apparent distress ABDOMEN: soft, nontender, no rebound or guarding, bowel sounds noted throughout abdomen WU:[id number] scrotal swelling, right >left with significant tenderness. No crepitus/erythema noted.  Nurse chaperone at bedside for exam NEURO: Pt is awake/alert/appropriate, moves all extremitiesx4.  No facial droop.   EXTREMITIES: pulses normal/equal, full ROM SKIN: warm, color normal PSYCH: anxious  ED Course  Procedures   12:47 PM Pt with acute onset of right testicle pain.  He has large amount of swelling which limits exam.  I suspect this may be recurrent inguinal hernia  However will proceed with [place] to r/o testicular torsion He has been NPO since last night 2:32 PM Pt improving [place] results pending 2:56 PM [place] confirms large inguinal hernia Unable to reduce at bedside Will consult surgery 3:17 PM D/w surgery.  They will see patient in the ED Imaging Review US Scrotum  04/21/2014   CLINICAL DATA:  29 year old male with right testicular pain and swelling. Personal history of right hernia repair. Initial encounter.  EXAM: SCROTAL ULTRASOUND  DOPPLER ULTRASOUND OF THE TESTICLES  TECHNIQUE: Complete ultrasound examination of the testicles, epididymis, and other scrotal structures was performed. Color and spectral Doppler ultrasound were also utilized to evaluate blood flow to the testicles.  COMPARISON:  04/27/2012.  FINDINGS: Right testicle  Measurements: 4.8 x 2.8 x 3.8 cm. No mass  or microlithiasis visualized.  Left testicle  Measurements: 5.0 x 2.8 x 3.0 cm. No mass or microlithiasis visualized.  Right epididymis:  Normal in size and appearance.  Left epididymis:  Normal in size and appearance.  Hydrocele:  None visualized.   Varicocele:  None visualized.  Pulsed Doppler interrogation of both testes demonstrates normal low resistance arterial and venous waveforms bilaterally.  Other findings: Asymmetric soft tissue in the right inguinal canal displacing the right testicle inferiorly, similar to that depicted in 2014. This has gut signature on cine images (image 8 series 2 and image 7 series 1).  IMPRESSION: 1. Evidence of bowel containing right inguinal hernia. 2. Otherwise negative scrotal ultrasound.  No testicular torsion.   Electronically Signed   By: Odessa FlemingH  Hall M.D.   On: 04/21/2014 14:33   [place] Art/ven Flow Abd Pelv Doppler  04/21/2014   CLINICAL DATA:  29 year old male with right testicular pain and swelling. Personal history of right hernia repair. Initial encounter.  EXAM: SCROTAL ULTRASOUND  DOPPLER ULTRASOUND OF THE TESTICLES  TECHNIQUE: Complete ultrasound examination of the testicles, epididymis, and other scrotal structures was performed. Color and spectral Doppler ultrasound were also utilized to evaluate blood flow to the testicles.  COMPARISON:  04/27/2012.  FINDINGS: Right testicle  Measurements: 4.8 x 2.8 x 3.8 cm. No mass or microlithiasis visualized.  Left testicle  Measurements: 5.0 x 2.8 x 3.0 cm. No mass or microlithiasis visualized.  Right epididymis:  Normal in size and appearance.  Left epididymis:  Normal in size and appearance.  Hydrocele:  None visualized.  Varicocele:  None visualized.  Pulsed Doppler interrogation of both testes demonstrates normal low resistance arterial and venous waveforms bilaterally.  Other findings: Asymmetric soft tissue in the right inguinal canal displacing the right testicle inferiorly, similar to that depicted in 2014. This has gut signature on cine images (image 8 series 2 and image 7 series 1).  IMPRESSION: 1. Evidence of bowel containing right inguinal hernia. 2. Otherwise negative scrotal ultrasound.  No testicular torsion.   Electronically Signed   By: Odessa FlemingH  Hall M.D.   On:  04/21/2014 14:33    Medications  HYDROmorphone (DILAUDID) injection 1 mg (not administered)  HYDROmorphone (DILAUDID) injection 1 mg (1 mg Intravenous Given 04/21/14 1302)  ondansetron (ZOFRAN) injection 4 mg (4 mg Intravenous Given 04/21/14 1302)  HYDROmorphone (DILAUDID) injection 1 mg (1 mg Intravenous Given 04/21/14 1323)  HYDROmorphone (DILAUDID) injection 1 mg (1 mg Intramuscular Given 04/21/14 1351)    MDM   Final diagnoses:  Pain in right testicle    Nursing notes including past medical history and social history reviewed and considered in documentation     Zadie Rhineonald Nohelani Benning, MD 04/21/14 [phone]

## 2014-04-21 NOTE — Anesthesia Preprocedure Evaluation (Addendum)
Anesthesia Evaluation  Patient identified by MRN, date of birth, ID band Patient awake    Reviewed: Allergy & Precautions, NPO status , Patient's Chart, lab work & pertinent test results, reviewed documented beta blocker date and time   History of Anesthesia Complications Negative for: history of anesthetic complications  Airway Mallampati: II  TM Distance: >3 FB Neck ROM: Full    Dental  (+) Chipped, Dental Advisory Given   Pulmonary Recent URI , Current Smoker,  breath sounds clear to auscultation        Cardiovascular negative cardio ROS  Rhythm:Regular Rate:Normal     Neuro/Psych negative neurological ROS     GI/Hepatic Neg liver ROS, Incarcerated inguinal hernia   Endo/Other  negative endocrine ROSMorbid obesity  Renal/GU negative Renal ROS     Musculoskeletal   Abdominal (+) + obese,   Peds  Hematology negative hematology ROS (+)   Anesthesia Other Findings   Reproductive/Obstetrics                            Anesthesia Physical Anesthesia Plan  ASA: II  Anesthesia Plan: General   Post-op Pain Management:    Induction: Intravenous and Rapid sequence  Airway Management Planned: Oral ETT  Additional Equipment:   Intra-op Plan:   Post-operative Plan: Extubation in OR  Informed Consent: I have reviewed the patients History and Physical, chart, labs and discussed the procedure including the risks, benefits and alternatives for the proposed anesthesia with the patient or authorized representative who has indicated his/her understanding and acceptance.   Dental advisory given  Plan Discussed with: CRNA and Surgeon  Anesthesia Plan Comments: (Plan routine monitors, GETA)        Anesthesia Quick Evaluation

## 2014-04-21 NOTE — Transfer of Care (Signed)
Immediate Anesthesia Transfer of Care Note  Patient: Casey Andrews  Procedure(s) Performed: Procedure(s): REPAIR OF RIGHT INCARCERATED INGUINAL HERNIA WITH MESH (Right) INSERTION OF MESH (Right)  Patient Location: PACU  Anesthesia Type:General  Level of Consciousness: Patient easily awoken, sedated, comfortable, cooperative, following commands, responds to stimulation.   Airway & Oxygen Therapy: Patient spontaneously breathing, ventilating well, oxygen via simple oxygen mask.  Post-op Assessment: Report given to PACU RN, vital signs reviewed and stable, moving all extremities.   Post vital signs: Reviewed and stable.  Complications: No apparent anesthesia complications

## 2014-04-22 ENCOUNTER — Encounter (HOSPITAL_COMMUNITY): Payer: Self-pay | Admitting: *Deleted

## 2014-04-22 MED ORDER — OXYCODONE-ACETAMINOPHEN 5-325 MG PO TABS: 1.0000 | ORAL_TABLET | ORAL | Status: DC | PRN

## 2014-04-22 MED ORDER — ACETAMINOPHEN 325 MG PO TABS: 650.0000 mg | ORAL_TABLET | Freq: Four times a day (QID) | ORAL | Status: AC | PRN

## 2014-04-22 NOTE — Discharge Summary (Signed)
Physician Discharge Summary  Patient ID: Casey Andrews MRN: [id number] DOB/AGE: Feb 08, 1985 29 y.o.  Admit date: 04/21/2014 Discharge date: 04/22/2014  Admitting Diagnosis: Incarcerated right inguinal hernia  Discharge Diagnosis Patient Active Problem List   Diagnosis Date Noted  . Incarcerated right inguinal hernia 04/21/2014    Consultants none  Imaging: US Scrotum  04/21/2014   CLINICAL DATA:  29 year old male with right testicular pain and swelling. Personal history of right hernia repair. Initial encounter.  EXAM: SCROTAL ULTRASOUND  DOPPLER ULTRASOUND OF THE TESTICLES  TECHNIQUE: Complete ultrasound examination of the testicles, epididymis, and other scrotal structures was performed. Color and spectral Doppler ultrasound were also utilized to evaluate blood flow to the testicles.  COMPARISON:  04/27/2012.  FINDINGS: Right testicle  Measurements: 4.8 x 2.8 x 3.8 cm. No mass or microlithiasis visualized.  Left testicle  Measurements: 5.0 x 2.8 x 3.0 cm. No mass or microlithiasis visualized.  Right epididymis:  Normal in size and appearance.  Left epididymis:  Normal in size and appearance.  Hydrocele:  None visualized.  Varicocele:  None visualized.  Pulsed Doppler interrogation of both testes demonstrates normal low resistance arterial and venous waveforms bilaterally.  Other findings: Asymmetric soft tissue in the right inguinal canal displacing the right testicle inferiorly, similar to that depicted in 2014. This has gut signature on cine images (image 8 series 2 and image 7 series 1).  IMPRESSION: 1. Evidence of bowel containing right inguinal hernia. 2. Otherwise negative scrotal ultrasound.  No testicular torsion.   Electronically Signed   By: Odessa Fleming M.D.   On: 04/21/2014 14:33   [place] Art/ven Flow Abd Pelv Doppler  04/21/2014   CLINICAL DATA:  29 year old male with right testicular pain and swelling. Personal history of right hernia repair. Initial encounter.  EXAM: SCROTAL ULTRASOUND   DOPPLER ULTRASOUND OF THE TESTICLES  TECHNIQUE: Complete ultrasound examination of the testicles, epididymis, and other scrotal structures was performed. Color and spectral Doppler ultrasound were also utilized to evaluate blood flow to the testicles.  COMPARISON:  04/27/2012.  FINDINGS: Right testicle  Measurements: 4.8 x 2.8 x 3.8 cm. No mass or microlithiasis visualized.  Left testicle  Measurements: 5.0 x 2.8 x 3.0 cm. No mass or microlithiasis visualized.  Right epididymis:  Normal in size and appearance.  Left epididymis:  Normal in size and appearance.  Hydrocele:  None visualized.  Varicocele:  None visualized.  Pulsed Doppler interrogation of both testes demonstrates normal low resistance arterial and venous waveforms bilaterally.  Other findings: Asymmetric soft tissue in the right inguinal canal displacing the right testicle inferiorly, similar to that depicted in 2014. This has gut signature on cine images (image 8 series 2 and image 7 series 1).  IMPRESSION: 1. Evidence of bowel containing right inguinal hernia. 2. Otherwise negative scrotal ultrasound.  No testicular torsion.   Electronically Signed   By: Odessa Fleming M.D.   On: 04/21/2014 14:33    Procedures Repair of right incarcerated inguinal hernia with insertion of mesh---Dr. Dian Queen Course:  Casey Andrews is a healthy 29 year old male with a history of right inguinal hernia repair in 2006 by Dr. Leeanne Mannan who [facility] to [facility] with with sudden onset right inguinal/scrotal pain. US showed an incarcerated right inguinal hernia.   Patient was admitted and underwent procedure listed above.  Tolerated procedure well and was transferred to the floor.  Diet was advanced as tolerated.  On POD#1, the patient was voiding well, tolerating diet, ambulating well, pain well controlled, vital  signs stable, incisions c/d/i and felt stable for discharge home. Medication risks, benefits and therapeutic alternatives were reviewed with the patient.  He  verbalizes understanding.   Patient will follow up in our office in 2-3 weeks and knows to call with questions or concerns.  Physical Exam: General:  Alert, NAD, pleasant, comfortable Abd:  Soft, ND, mild tenderness, incisions C/D/I    Medication List    TAKE these medications        acetaminophen 325 MG tablet  Commonly known as:  TYLENOL  Take 2 tablets (650 mg total) by mouth every 6 (six) hours as needed for mild pain (or Temp > 100).     NYQUIL PO  Take 30 mLs by mouth at bedtime as needed (cold symptoms).     oxyCODONE-acetaminophen 5-325 MG per tablet  Commonly known as:  PERCOCET/ROXICET  Take 1-2 tablets by mouth every 4 (four) hours as needed for moderate pain or severe pain.             Follow-up Information    Follow up with Follow up with list of slef pay/uninsured providers offered by ED Case manager .   Contact information:   please also respond to [facility] staff  [facility] assists with discounted doctors through "orange card services Call Scherry RanKaren Andrianos at [phone] [email or web address]      Follow up with Vanita PandaHOMAS, ALICIA C., MD.   Specialty:  General Surgery   Why:  2-3 weeks for a post op hernia repair   Contact information:   [address] [place] [address] [phone]       Signed: Ashok Norrismina Kolbi [place], [facility] HillNP-BC [facility] [phone]  04/22/2014, 10:31 AM

## 2014-04-22 NOTE — Discharge Instructions (Signed)
[facility] _______[facility]  UMBILICAL OR INGUINAL HERNIA REPAIR: POST OP INSTRUCTIONS  Always review your discharge instruction sheet given to you by the facility where your surgery was performed. IF YOU HAVE DISABILITY OR FAMILY LEAVE FORMS, YOU MUST BRING THEM TO THE OFFICE FOR PROCESSING.   DO NOT GIVE THEM TO YOUR DOCTOR.  1. A  prescription for pain medication may be given to you upon discharge.  Take your pain medication as prescribed, if needed.  If narcotic pain medicine is not needed, then you may take acetaminophen (Tylenol) or ibuprofen (Advil) as needed. 2. Take your usually prescribed medications unless otherwise directed. 3. If you need a refill on your pain medication, please contact your pharmacy.  They will contact our office to request authorization. Prescriptions will not be filled after 5 pm or on week-ends. 4. You should follow a light diet the first 24 hours after arrival home, such as soup and crackers, etc.  Be sure to include lots of fluids daily.  Resume your normal diet the day after surgery. 5. Most patients will experience some swelling and bruising around the umbilicus or in the groin and scrotum.  Ice packs and reclining will help.  Swelling and bruising can take several days to resolve.  6. It is common to experience some constipation if taking pain medication after surgery.  Increasing fluid intake and taking a stool softener (such as Colace) will usually help or prevent this problem from occurring.  A mild laxative (Milk of Magnesia or Miralax) should be taken according to package directions if there are no bowel movements after 48 hours. 7. Unless discharge instructions indicate otherwise, you may remove your bandages 24-48 hours after surgery, and you may shower at that time.  You may have steri-strips (small skin tapes) in place directly over the incision.  These strips should be left on the skin for 7-10 days.  If your surgeon used skin glue on the  incision, you may shower in 24 hours.  The glue will flake off over the next 2-3 weeks.  Any sutures or staples will be removed at the office during your follow-up visit. 8. ACTIVITIES:  You may resume regular (light) daily activities beginning the next day--such as daily self-care, walking, climbing stairs--gradually increasing activities as tolerated.  You may have sexual intercourse when it is comfortable.  Refrain from any heavy lifting or straining until approved by your doctor. a. You may drive when you are no longer taking prescription pain medication, you can comfortably wear a seatbelt, and you can safely maneuver your car and apply brakes. b. RETURN TO WORK:  __________________________________________________________ 9. You should see your doctor in the office for a follow-up appointment approximately 2-3 weeks after your surgery.  Make sure that you call for this appointment within a day or two after you arrive home to insure a convenient appointment time. 10. OTHER INSTRUCTIONS:  __________________________________________________________________________________________________________________________________________________________________________________________  WHEN TO CALL YOUR DOCTOR: 1. Fever over 101.0 2. Inability to urinate 3. Nausea and/or vomiting 4. Extreme swelling or bruising 5. Continued bleeding from incision. 6. Increased pain, redness, or drainage from the incision  The clinic staff is available to answer your questions during regular business hours.  Please don't hesitate to call and ask to speak to one of the nurses for clinical concerns.  If you have a medical emergency, go to the nearest emergency room or call 911.  A surgeon from [facility] is always on call at the hospital     [address], [address], [place], [place]  [address] ?  [address] [address], [place], [place]   [address] [phone] ? [phone] ? FAX [phone] Web site:  [email or web address]  

## 2014-04-22 NOTE — Progress Notes (Signed)
Patient is alert and oriented, vital signs are stable, incisions are within normal limits, patient is tolerating diet, discharge instructions reviewed with patient, patient to follow up with MD Samuel Jesterhomas Patches Mcdonnell, Myrtie HawkKennitrish N RN 11:01 AM 11:01am

## 2014-04-22 NOTE — Progress Notes (Signed)
UR completed 

## 2014-10-11 ENCOUNTER — Emergency Department (HOSPITAL_COMMUNITY): Payer: Self-pay

## 2014-10-11 ENCOUNTER — Encounter (HOSPITAL_COMMUNITY): Payer: Self-pay | Admitting: Emergency Medicine

## 2014-10-11 ENCOUNTER — Inpatient Hospital Stay (HOSPITAL_COMMUNITY)
Admission: EM | Admit: 2014-10-11 | Discharge: 2014-10-12 | DRG: 443 | Disposition: A | Discharge status: Home / Self Care | Payer: Self-pay | Attending: General Surgery | Admitting: General Surgery

## 2014-10-11 DIAGNOSIS — Z23 Encounter for immunization: Secondary | ICD-10-CM

## 2014-10-11 DIAGNOSIS — F17210 Nicotine dependence, cigarettes, uncomplicated: Secondary | ICD-10-CM | POA: Diagnosis present

## 2014-10-11 DIAGNOSIS — S36113A Laceration of liver, unspecified degree, initial encounter: Principal | ICD-10-CM | POA: Diagnosis present

## 2014-10-11 DIAGNOSIS — W11XXXA Fall on and from ladder, initial encounter: Secondary | ICD-10-CM | POA: Diagnosis present

## 2014-10-11 DIAGNOSIS — W19XXXA Unspecified fall, initial encounter: Secondary | ICD-10-CM

## 2014-10-11 DIAGNOSIS — Z9889 Other specified postprocedural states: Secondary | ICD-10-CM

## 2014-10-11 LAB — CBC
HCT: 40 % (ref 39.0–52.0)
Hemoglobin: 13.8 g/dL (ref 13.0–17.0)
MCH: 31.1 pg (ref 26.0–34.0)
MCHC: 34.5 g/dL (ref 30.0–36.0)
MCV: 90.1 fL (ref 78.0–100.0)
PLATELETS: 191 10*3/uL (ref 150–400)
RBC: 4.44 MIL/uL (ref 4.22–5.81)
RDW: 13.4 % (ref 11.5–15.5)
WBC: 9.4 10*3/uL (ref 4.0–10.5)

## 2014-10-11 LAB — I-STAT CHEM 8, ED
BUN: 13 mg/dL (ref 6–20)
CALCIUM ION: 1.13 mmol/L (ref 1.12–1.23)
CHLORIDE: 104 mmol/L (ref 101–111)
Creatinine, Ser: 0.8 mg/dL (ref 0.61–1.24)
GLUCOSE: 97 mg/dL (ref 65–99)
HCT: 44 % (ref 39.0–52.0)
HEMOGLOBIN: 15 g/dL (ref 13.0–17.0)
Potassium: 3.5 mmol/L (ref 3.5–5.1)
SODIUM: 137 mmol/L (ref 135–145)
TCO2: 24 mmol/L (ref 0–100)

## 2014-10-11 LAB — MRSA PCR SCREENING: MRSA BY PCR: NEGATIVE

## 2014-10-11 MED ORDER — ONDANSETRON HCL 4 MG PO TABS: 4.0000 mg | ORAL_TABLET | Freq: Four times a day (QID) | ORAL | Status: DC | PRN

## 2014-10-11 MED ORDER — DOCUSATE SODIUM 100 MG PO CAPS
100.0000 mg | ORAL_CAPSULE | Freq: Two times a day (BID) | ORAL | Status: DC
  Administered 2014-10-11 – 2014-10-12 (×2): 100 mg via ORAL
  Filled 2014-10-11 (×2): qty 1

## 2014-10-11 MED ORDER — OXYCODONE-ACETAMINOPHEN 5-325 MG PO TABS
2.0000 | ORAL_TABLET | Freq: Once | ORAL | Status: AC
  Administered 2014-10-11: 2 via ORAL
  Filled 2014-10-11: qty 2

## 2014-10-11 MED ORDER — FENTANYL CITRATE (PF) 100 MCG/2ML IJ SOLN
50.0000 ug | Freq: Once | INTRAMUSCULAR | Status: AC
  Administered 2014-10-11: 50 ug via INTRAVENOUS
  Filled 2014-10-11: qty 2

## 2014-10-11 MED ORDER — ONDANSETRON HCL 4 MG/2ML IJ SOLN
4.0000 mg | Freq: Four times a day (QID) | INTRAMUSCULAR | Status: DC | PRN
  Administered 2014-10-11: 4 mg via INTRAVENOUS
  Filled 2014-10-11: qty 2

## 2014-10-11 MED ORDER — MORPHINE SULFATE (PF) 2 MG/ML IV SOLN
2.0000 mg | INTRAVENOUS | Status: DC | PRN
  Administered 2014-10-11 – 2014-10-12 (×4): 4 mg via INTRAVENOUS
  Filled 2014-10-11 (×4): qty 2

## 2014-10-11 MED ORDER — PNEUMOCOCCAL VAC POLYVALENT 25 MCG/0.5ML IJ INJ
0.5000 mL | INJECTION | INTRAMUSCULAR | Status: AC
  Administered 2014-10-12: 0.5 mL via INTRAMUSCULAR
  Filled 2014-10-11: qty 0.5

## 2014-10-11 MED ORDER — NICOTINE 14 MG/24HR TD PT24
14.0000 mg | MEDICATED_PATCH | Freq: Every day | TRANSDERMAL | Status: DC
  Administered 2014-10-11 – 2014-10-12 (×2): 14 mg via TRANSDERMAL
  Filled 2014-10-11 (×2): qty 1

## 2014-10-11 MED ORDER — IOHEXOL 300 MG/ML  SOLN
100.0000 mL | Freq: Once | INTRAMUSCULAR | Status: AC | PRN
  Administered 2014-10-11: 100 mL via INTRAVENOUS

## 2014-10-11 MED ORDER — OXYCODONE HCL 5 MG PO TABS
10.0000 mg | ORAL_TABLET | ORAL | Status: DC | PRN
  Administered 2014-10-11 – 2014-10-12 (×2): 10 mg via ORAL
  Filled 2014-10-11 (×2): qty 2

## 2014-10-11 MED ORDER — SODIUM CHLORIDE 0.9 % IV SOLN
INTRAVENOUS | Status: DC
  Administered 2014-10-11: 100 mL/h via INTRAVENOUS
  Administered 2014-10-12: 04:00:00 via INTRAVENOUS

## 2014-10-11 NOTE — ED Notes (Addendum)
Attempted to IV venipuncture x2 unsuccessful. Pt tolerated well. Will have another nurse to attempt.

## 2014-10-11 NOTE — ED Notes (Signed)
Awake. Verbally responsive. A/O x4. Resp even and unlabored. No audible adventitious breath sounds noted. ABC's intact. IV saline lock patent and intact. 

## 2014-10-11 NOTE — ED Notes (Signed)
Bed: WA06 Expected date:  Expected time:  Means of arrival:  Comments: 

## 2014-10-11 NOTE — ED Notes (Signed)
Awake. Verbally responsive. A/O x4. Resp even and unlabored. No audible adventitious breath sounds noted. ABC's intact. IV saline lock x1 and infusing NS at 159ml/hr without difficulty.

## 2014-10-11 NOTE — ED Notes (Signed)
Patient transported to CT 

## 2014-10-11 NOTE — ED Provider Notes (Signed)
CSN: [id number]     Arrival date & time 10/11/14  1035 History   First MD Initiated Contact with Patient 10/11/14 1053     Chief Complaint  Patient presents with  . Fall     HPI Pt reported rt rib pain s/p to falling from 66ft ladder landing on tree root. Pt denies hitting head, headaches, LOC and dizziness/lightheadedness. Pt ambulated with steady gait. Auscultated clear breath sounds bil and throughout. Resp even and unlabored. No audible adventitious breath sounds noted. Sats at 98% RA History reviewed. No pertinent past medical history. Past Surgical History  Procedure Laterality Date  . Hernia repair      2008  . Right inguinal hernia repair    . Inguinal hernia repair Right 04/21/2014    Procedure: REPAIR OF RIGHT INCARCERATED INGUINAL HERNIA WITH MESH;  Surgeon: Romie Levee, MD;  Location: [facility];  Service: General;  Laterality: Right;  . Insertion of mesh Right 04/21/2014    Procedure: INSERTION OF MESH;  Surgeon: Romie Levee, MD;  Location: [facility];  Service: General;  Laterality: Right;   History reviewed. No pertinent family history. Social History  Substance Use Topics  . Smoking status: Current Every Day Smoker -- 0.50 packs/day for 6 years    Types: Cigarettes  . Smokeless tobacco: Never Used  . Alcohol Use: No    Review of Systems  All other systems reviewed and are negative  Allergies  Review of patient's allergies indicates no known allergies.  Home Medications   Prior to Admission medications   Medication Sig Start Date End Date Taking? Authorizing Provider  acetaminophen (TYLENOL) 325 MG tablet Take 2 tablets (650 mg total) by mouth every 6 (six) hours as needed for mild pain (or Temp > 100). Patient not taking: Reported on 10/11/2014 04/22/14   Ashok Norris, NP  oxyCODONE 10 MG TABS Take 0.5-1 tablets (5-10 mg total) by mouth every 6 (six) hours as needed for severe pain. 10/12/14   Emina Riebock, NP   BP 131/77 mmHg  Pulse 70  Temp(Src) 98.6 F (37  C) (Oral)  Resp 17  Ht  (1.753 m)  Wt 246 lb 7.6 oz (111.8 kg)  BMI 36.38 kg/m2  SpO2 97% Physical Exam Physical Exam  Nursing note and vitals reviewed. Constitutional: He is oriented to person, place, and time. He appears well-developed and well-nourished. No distress.  HENT:  Head: Normocephalic and atraumatic.  Eyes: Pupils are equal, round, and reactive to light.  Neck: Normal range of motion.  Cardiovascular: Normal rate and intact distal pulses.   Pulmonary/Chest: No respiratory distress.  Patient has exquisite tenderness to the right lower chest area.  Tenderness extends in the right upper quadrant.  Breath sounds are diminished but equal bilaterally.   Abdominal: Normal appearance. He exhibits no distension.  Musculoskeletal: Normal range of motion.  Neurological: He is alert and oriented to person, place, and time. No cranial nerve deficit.  Skin: Skin is warm and dry. No rash noted.  Psychiatric: He has a normal mood and affect. His behavior is normal.   ED Course  Procedures (including critical care time) Labs Review Labs Reviewed  MRSA PCR SCREENING  CBC  CBC  CBC  I-STAT CHEM 8, ED    Imaging Review No results found. I have personally reviewed and evaluated these images and lab results as part of my medical decision-making.  CT of the abdomen and pelvis: IMPRESSION: 1. Small amount of perihepatic hemorrhagic ascites. Small focal laceration in right  hepatic lobe anteriorly axial image 15 measures 2 cm. Minimal extravasation/hemorrhage at the site of laceration axial image 16. 2. No hydronephrosis or hydroureter. No renal laceration. 3. No evidence of urinary bladder injury. 4. No pericecal inflammation. Normal appendix. 5. No acute fractures are noted within abdomen and pelvis. These results were called by telephone at the time of interpretation on 10/11/2014 at 1:38 pm to Dr. Nelva Nay , who verbally acknowledged these results.  Trauma  surgery was consulted patient was transferred intubated for observation.  MDM   Final diagnoses:  Fall   small liver laceration      Nelva Nay, MD 10/15/14 [phone]

## 2014-10-11 NOTE — ED Notes (Signed)
[facility] called and given report to [employer].

## 2014-10-11 NOTE — ED Notes (Signed)
[facility] arrived to transport pt to [facility]. 

## 2014-10-11 NOTE — ED Notes (Addendum)
Pt reported rt rib pain s/p to falling from 34ft ladder landing on tree root. Pt denies hitting head, headaches, LOC and dizziness/lightheadedness. Pt ambulated with steady gait. Auscultated clear breath sounds bil and throughout. Resp even and unlabored. No audible adventitious breath sounds noted. Sats at 98% RA

## 2014-10-11 NOTE — ED Notes (Signed)
Awake. Verbally responsive. A/O x4. Resp even and unlabored. No audible adventitious breath sounds noted. ABC's intact. IV x2 saline lock patent and intact.

## 2014-10-11 NOTE — ED Notes (Signed)
Bed: WTR6 Expected date:  Expected time:  Means of arrival:  Comments: 

## 2014-10-11 NOTE — ED Notes (Signed)
Awake. Verbally responsive. A/O x4. Resp even and unlabored. No audible adventitious breath sounds noted. ABC's intact. IV x2 saline lock patent and intact.  

## 2014-10-11 NOTE — H&P (Signed)
History   Casey Andrews is an 30 y.o. male.   Chief Complaint:  Chief Complaint  Patient presents with  . Fall    HPIthis is a 29 year old male 7 feet on a letter when he fell landing on his right side earlier today. He has severe pain and was brought to the emergency department. Rib x-rays did not demonstrate an obvious fracture. CT scan was performed demonstrating a grade 1 liver laceration with a little bit of extravasation of contrast and some perihepatic fluid. I was asked to see him because of this. He denies loss of consciousness. He denies pain anywhere else.  History reviewed. No pertinent past medical history.  Past Surgical History  Procedure Laterality Date  . Hernia repair      2008  . Right inguinal hernia repair    . Inguinal hernia repair Right 04/21/2014    Procedure: REPAIR OF RIGHT INCARCERATED INGUINAL HERNIA WITH MESH;  Surgeon: Romie Levee, MD;  Location: [facility];  Service: General;  Laterality: Right;  . Insertion of mesh Right 04/21/2014    Procedure: INSERTION OF MESH;  Surgeon: Romie Levee, MD;  Location: [facility];  Service: General;  Laterality: Right;    History reviewed. No pertinent family history. Social History:  reports that he has been smoking Cigarettes.  He has a 3 pack-year smoking history. He has never used smokeless tobacco. He reports that he does not drink alcohol or use illicit drugs.  Allergies  No Known Allergies  Home Medications   (Not in a hospital admission)  Trauma Course   Results for orders placed or performed during the hospital encounter of 10/11/14 (from the past 48 hour(s))  I-stat chem 8, ed     Status: None   Collection Time: 10/11/14  2:21 PM  Result Value Ref Range   Sodium 137 135 - 145 mmol/L   Potassium 3.5 3.5 - 5.1 mmol/L   Chloride 104 101 - 111 mmol/L   BUN 13 6 - 20 mg/dL   Creatinine, Ser 9.60 0.61 - 1.24 mg/dL   Glucose, Bld 97 65 - 99 mg/dL   Calcium, Ion 4.54 0.98 - 1.23 mmol/L   TCO2 24 0 - 100 mmol/L    Hemoglobin 15.0 13.0 - 17.0 g/dL   HCT 11.9 14.7 - 82.9 %   Dg Ribs Unilateral W/chest Right  10/11/2014   CLINICAL DATA:  Fall from ladder today  EXAM: RIGHT RIBS AND CHEST - 3+ VIEW  COMPARISON:  02/02/2006  FINDINGS: Five views right ribs submitted. No acute infiltrate or pulmonary edema. No rib fracture. No pneumothorax.  IMPRESSION: Negative.   Electronically Signed   By: Natasha Mead M.D.   On: 10/11/2014 11:27   Ct Abdomen Pelvis W Contrast  10/11/2014   CLINICAL DATA:  Right rib pain , status post fall from ladder  EXAM: CT ABDOMEN AND PELVIS WITH CONTRAST  TECHNIQUE: Multidetector CT imaging of the abdomen and pelvis was performed using the standard protocol following bolus administration of intravenous contrast.  CONTRAST:  OMNIPAQUE IOHEXOL 300 MG/ML  SOLN  COMPARISON:  Right ribs same day  FINDINGS: No lower rib fractures are noted. Sagittal images of the spine are unremarkable. No acute fractures are noted. No pelvic fractures are seen.  Enhanced liver shows small laceration anterior aspect of the right hepatic lobe axial image 15 measures about 2 cm. Small amount of perihepatic hemorrhagic ascites. No large hematoma is noted.  The pancreas, spleen and adrenal glands are un- kidneys are  symmetrical in size and enhancement. No hydronephrosis or hydroureter. No evidence of renal laceration. Abdominal aorta is unremarkable.  No small bowel obstruction. No ascites or free air. No adenopathy. No thickened or dilated small bowel loops.  No aortic aneurysm. There is no pericecal inflammation. Normal appendix. The terminal ileum is unremarkable.  No evidence of urinary bladder injury. Prostate gland and seminal vesicles are unremarkable. No inguinal adenopathy. Minimal degenerative changes bilateral SI joints.  Delayed renal images shows bilateral renal symmetrical excretion. Bilateral visualized proximal ureter is unremarkable. Coronal images shows unremarkable bilateral hip joints.  IMPRESSION:  1. Small amount of perihepatic hemorrhagic ascites. Small focal laceration in right hepatic lobe anteriorly axial image 15 measures 2 cm. Minimal extravasation/hemorrhage at the site of laceration axial image 16. 2. No hydronephrosis or hydroureter.  No renal laceration. 3. No evidence of urinary bladder injury. 4. No pericecal inflammation.  Normal appendix. 5. No acute fractures are noted within abdomen and pelvis. These results were called by telephone at the time of interpretation on 10/11/2014 at 1:38 pm to Dr. Nelva Nay , who verbally acknowledged these results.   Electronically Signed   By: Natasha Mead M.D.   On: 10/11/2014 13:39    Review of Systems  Constitutional: Negative for fever and chills.  Respiratory: Positive for shortness of breath.   Cardiovascular: Positive for chest pain.  Gastrointestinal: Negative for abdominal pain.  Musculoskeletal: Negative for back pain, joint pain and neck pain.    Blood pressure 142/68, pulse 74, temperature 98.1 F (36.7 C), temperature source Oral, resp. rate 20, SpO2 97 %. Physical Exam  Constitutional: He is oriented to person, place, and time.  Muscular male in no acute distress.  HENT:  Head: Normocephalic and atraumatic.  Eyes: EOM are normal. Pupils are equal, round, and reactive to light.  Neck: Neck supple.  No cervical spine tenderness.  Cardiovascular: Normal rate and regular rhythm.   Respiratory: Effort normal and breath sounds normal.  Right lateral chest wall tenderness. No crepitus.  GI: Soft. There is no tenderness.  Musculoskeletal: He exhibits no tenderness.  No palpable bony deformities.  Neurological: He is alert and oriented to person, place, and time.  Full range of motion of all extremities.  Psychiatric: He has a normal mood and affect. His behavior is normal.     Assessment/Plan Fall resulting in liver laceration. No hemodynamic instability. Plan: Transfer to [facility] trauma center. Stepped on unit.  Bedrest. Serial hemoglobin checks.  ROSENBOWER,TODD J 10/11/2014, 3:11 PM   Procedures

## 2014-10-11 NOTE — Progress Notes (Signed)
Patient transferred from [facility] via [facility] at 1820 in stretcher. Neuro stable, c/o 7/10 Right flank pain. Pt verbalized understanding after patient ws oriented to floor and room. Bed in lowest position. Call bell active

## 2014-10-11 NOTE — ED Notes (Signed)
Awake. Verbally responsive. A/O x4. Resp even and unlabored. No audible adventitious breath sounds noted. ABC's intact. IV x2 saline lock patent and intact. Family at bedside.

## 2014-10-12 ENCOUNTER — Inpatient Hospital Stay (HOSPITAL_COMMUNITY)

## 2014-10-12 LAB — CBC
HCT: 41.5 % (ref 39.0–52.0)
HEMATOCRIT: 41.9 % (ref 39.0–52.0)
HEMOGLOBIN: 14.3 g/dL (ref 13.0–17.0)
Hemoglobin: 14.2 g/dL (ref 13.0–17.0)
MCH: 31.3 pg (ref 26.0–34.0)
MCH: 31.3 pg (ref 26.0–34.0)
MCHC: 34.1 g/dL (ref 30.0–36.0)
MCHC: 34.2 g/dL (ref 30.0–36.0)
MCV: 91.7 fL (ref 78.0–100.0)
MCV: 91.6 fL (ref 78.0–100.0)
PLATELETS: 195 10*3/uL (ref 150–400)
Platelets: 190 10*3/uL (ref 150–400)
RBC: 4.57 MIL/uL (ref 4.22–5.81)
RBC: 4.53 MIL/uL (ref 4.22–5.81)
RDW: 13.6 % (ref 11.5–15.5)
RDW: 13.5 % (ref 11.5–15.5)
WBC: 8.3 10*3/uL (ref 4.0–10.5)
WBC: 7.8 10*3/uL (ref 4.0–10.5)

## 2014-10-12 MED ORDER — OXYCODONE HCL 10 MG PO TABS: 5.0000 mg | ORAL_TABLET | Freq: Four times a day (QID) | ORAL | Status: AC | PRN

## 2014-10-12 MED ORDER — MORPHINE SULFATE (PF) 2 MG/ML IV SOLN
2.0000 mg | INTRAVENOUS | Status: DC | PRN
  Administered 2014-10-12: 2 mg via INTRAVENOUS
  Administered 2014-10-12: 4 mg via INTRAVENOUS
  Filled 2014-10-12: qty 2
  Filled 2014-10-12: qty 1

## 2014-10-12 NOTE — Clinical Social Work Note (Signed)
Clinical Social Work Assessment  Patient Details  Name: Casey Andrews MRN: [id number] Date of Birth: [birth year]  Date of referral:  10/12/14               Reason for consult:  Trauma                Permission sought to share information with:    Permission granted to share information::     Name::        Agency::     Relationship::     Contact Information:     Housing/Transportation Living arrangements for the past 2 months:  Single Family Home Source of Information:  Patient Patient Interpreter Needed:  None Criminal Activity/Legal Involvement Pertinent to Current Situation/Hospitalization:  No - Comment as needed Significant Relationships:  Parents, Significant Other Lives with:  Self Do you feel safe going back to the place where you live?  Yes Need for family participation in patient care:     Care giving concerns:  Patient fell while attempting to cut a tree limb, which resulted in a small laceration of his liver (per patient).    Social Worker assessment / plan:  Freight forwarder met with patient. Patient discussed incident. Patient states he was on the top of a ladder and chainsaw caused him to lose his balance, resulting in a fall. Patient was alert, oriented, and pleasant. Patient denies any current needs or concerns. Patient states that he has family support to assist him as needed at home. Patient is hopeful to be able to return home today. Patient stated that he has transportation home. CSW Supervisor completed SBIRT on patient. He states that he only drinks alcohol socially and denies any concerns.   Employment status:  Unemployed Forensic scientist:  Self Pay (Medicaid Pending) PT Recommendations:  No Follow Up Information / Referral to community resources:     Patient/Family's Response to care:  Patient states he hopes to be able to return home today. Discharge will be determined once blood work has been completed.   Patient/Family's Understanding of and  Emotional Response to Diagnosis, Current Treatment, and Prognosis: Patient noted to be very pleasant and appropriate during visit. Patient exhibited a good understanding of his current diagnosis and treatment and denied any current concerns or questions.   Emotional Assessment Appearance:  Appears stated age Attitude/Demeanor/Rapport:    Affect (typically observed):  Appropriate, Calm, Pleasant Orientation:  Oriented to Self, Oriented to Place, Oriented to  Time, Oriented to Situation Alcohol / Substance use:  Alcohol Use (Occasional use of alcohol (sporting events)) Psych involvement (Current and /or in the community):  No (Comment)  Discharge Needs  Concerns to be addressed:  No discharge needs identified Readmission within the last 30 days:  No Current discharge risk:  None Barriers to Discharge:  No Barriers Identified   Maggie Cox, Student-SW 10/12/2014, 3:01 PM

## 2014-10-12 NOTE — Progress Notes (Signed)
Pt dressed, IVs removed, IS provided and instructions reviewed with pt.  Pt able to pull 2250 on the Incentive Spirometer without difficulty.  All personal belongings accompanied pt for d/c via wheelchair, including his cell phone.  Pt stated his pain remains 7/10 to right upper abdomen.

## 2014-10-12 NOTE — Discharge Instructions (Signed)
Liver Laceration A liver laceration is a tear or cut in the liver. The liver is the largest solid organ in the body and is involved in many important bodily functions. Sometimes, a liver laceration can be a very serious injury. It can cause a lot of bleeding. Surgery may be needed. Other times, a liver laceration may be minor. Bed rest may be all that is needed. Either way, treatment in a hospital is almost always required.  Liver lacerations are categorized in grades from 1 to 5. Low numbers identify lacerations that are less severe than those with high numbers.   Grade 1: This is a tear in the outer lining of the liver. It is less than  inch (1 cm) deep.   Grade 2: This is a tear that is about  inch to 1 inch (1 to 3 cm) deep. It is less than 4 inches (10 cm) long.   Grade 3: This is a tear that is slightly more than 1 inch (3 cm) deep.   Grades 4 and 5: These lacerations are very deep. They affect a large part of the liver.  CAUSES  A strong blow to the area around your liver (blunt trauma). Blunt trauma can tear the liver even though it does not break the skin.  An injury in which an object goes through the skin into the liver (penetrating injury). SIGNS AND SYMPTOMS The most common symptoms of liver laceration are:   A swollen and firm abdomen.   Pain in the abdomen.   Tenderness when pressing on the right side of the abdomen.  Other symptoms may include:   Bleeding from a penetrating wound.   Spitting up blood.   Bruises on the abdomen.   A fast heartbeat.   Taking quick breaths.   Feeling weak and dizzy.  DIAGNOSIS To determine if you have a liver laceration, your health care provider will perform a physical exam and ask about any injuries to the right side of the abdomen. Various tests may be ordered, such as:  Blood tests. Your blood may be tested every few hours. This will show if you are losing blood.   CT scan. This test is used to check for  laceration or bleeding.  Laparoscopy. This involves placing a small camera into the abdomen and looking directly at the surface of the liver. TREATMENT Treatment for a liver laceration will vary depending on how deep the cut is and on the amount of bleeding. Treatment options include:  Bed rest. The person is watched closely. Tests are done very often.   Blood transfusions. Blood is given from someone else. This replaces blood lost from the injury. A transfusion may need to be done several times.   Surgery. A procedure may be needed to open the abdomen. Then, special material might be packed around the laceration to help it heal, or the laceration might be repaired. HOME CARE INSTRUCTIONS  Only take over-the-counter or prescription medicines as directed by your health care provider. Take all prescribed medicines exactly as directed. Do not take any other medicines without first asking your health care provider.  Rest and limit your activity as directed by your health care provider. It may be several months before you can go back to your old routine. Do not participate in activities that involve physical contact or require extra energy until your health care provider approves.  Keep all follow-up appointments with your health care provider. You may need more blood tests or another CT  scan to make sure your injury is healing. SEEK MEDICAL CARE IF:  Your abdominal pain does not go away.   You feel more weak and tired than usual.  SEEK IMMEDIATE MEDICAL CARE IF:  Your abdominal pain gets worse.   You have a cut or incision on your skin that becomes red, swells, or leaks any fluid.   You feel dizzy or very weak.   You have trouble breathing.   You have a fever.  MAKE SURE YOU:  Understand these instructions.  Will watch your condition.  Will get help right away if you are not doing well or get worse. Document Released: 02/11/2010 Document Revised: 09/11/2012 Document  Reviewed: 07/01/2012 [facility] Patient Information 2015 [place], [place]. This information is not intended to replace advice given to you by your health care provider. Make sure you discuss any questions you have with your health care provider.

## 2014-10-12 NOTE — Discharge Summary (Signed)
Physician Discharge Summary  Casey Andrews ZOX:[id number] DOB: 1985/03/22 DOA: 10/11/2014  PCP: No PCP Per Patient  Consultation: none  Admit date: 10/11/2014 Discharge date: 10/12/2014  Recommendations for Outpatient Follow-up:   Follow-up Information    Follow up with [facility].   Why:  As needed   Contact information:   [address] [place] [address] [phone]     Discharge Diagnoses:  1. Fall 2. Liver laceration   Surgical Procedure: none  Discharge Condition: stable Disposition: home  Diet recommendation: regular  Filed Weights   10/11/14 1900  Weight: 111.8 kg (246 lb 7.6 oz)     Filed Vitals:   10/12/14 1418  BP:   Pulse:   Temp: 98.6 F (37 C)  Resp:      Hospital Course:  Casey Andrews came to [facility] after falling off ladder.  He was found to have a grade II liver laceration.  He was admitted to [facility].  He remained on bedrest overnight.  Serial hemoglobins remained stable.  He was then mobilized and diet was advanced.  Post mobilization H&H remained stable.  He was therefore felt stable for discharge home.  Medication risks, benefits and therapeutic alternatives were reviewed with the patient.  He verbalizes understanding.  He was encouraged to call with questions or concerns.    Discharge Instructions     Medication List    STOP taking these medications        oxyCODONE-acetaminophen 5-325 MG per tablet  Commonly known as:  PERCOCET/ROXICET      TAKE these medications        acetaminophen 325 MG tablet  Commonly known as:  TYLENOL  Take 2 tablets (650 mg total) by mouth every 6 (six) hours as needed for mild pain (or Temp > 100).     Oxycodone HCl 10 MG Tabs  Take 0.5-1 tablets (5-10 mg total) by mouth every 6 (six) hours as needed for severe pain.           Follow-up Information    Follow up with [facility].   Why:  As needed   Contact information:   [address] [place] [address] [phone]       The results of significant diagnostics from this hospitalization (including imaging, microbiology, ancillary and laboratory) are listed below for reference.    Significant Diagnostic Studies: Dg Ribs Unilateral W/chest Right  10/11/2014   CLINICAL DATA:  Fall from ladder today  EXAM: RIGHT RIBS AND CHEST - 3+ VIEW  COMPARISON:  02/02/2006  FINDINGS: Five views right ribs submitted. No acute infiltrate or pulmonary edema. No rib fracture. No pneumothorax.  IMPRESSION: Negative.   Electronically Signed   By: Casey Andrews M.D.   On: 10/11/2014 11:27   Ct Abdomen Pelvis W Contrast  10/11/2014   CLINICAL DATA:  Right rib pain , status post fall from ladder  EXAM: CT ABDOMEN AND PELVIS WITH CONTRAST  TECHNIQUE: Multidetector CT imaging of the abdomen and pelvis was performed using the standard protocol following bolus administration of intravenous contrast.  CONTRAST:  OMNIPAQUE IOHEXOL 300 MG/ML  SOLN  COMPARISON:  Right ribs same day  FINDINGS: No lower rib fractures are noted. Sagittal images of the spine are unremarkable. No acute fractures are noted. No pelvic fractures are seen.  Enhanced liver shows small laceration anterior aspect of the right hepatic lobe axial image 15 measures about 2 cm. Small  amount of perihepatic hemorrhagic ascites. No large hematoma is noted.  The pancreas, spleen and adrenal glands are un- kidneys are symmetrical in size and enhancement. No hydronephrosis or hydroureter. No evidence of renal laceration. Abdominal aorta is unremarkable.  No small bowel obstruction. No ascites or free air. No adenopathy. No thickened or dilated small bowel loops.  No aortic aneurysm. There is no pericecal inflammation. Normal appendix. The terminal ileum is unremarkable.  No evidence of urinary bladder injury. Prostate gland and seminal vesicles are unremarkable. No inguinal adenopathy. Minimal degenerative  changes bilateral SI joints.  Delayed renal images shows bilateral renal symmetrical excretion. Bilateral visualized proximal ureter is unremarkable. Coronal images shows unremarkable bilateral hip joints.  IMPRESSION: 1. Small amount of perihepatic hemorrhagic ascites. Small focal laceration in right hepatic lobe anteriorly axial image 15 measures 2 cm. Minimal extravasation/hemorrhage at the site of laceration axial image 16. 2. No hydronephrosis or hydroureter.  No renal laceration. 3. No evidence of urinary bladder injury. 4. No pericecal inflammation.  Normal appendix. 5. No acute fractures are noted within abdomen and pelvis. These results were called by telephone at the time of interpretation on 10/11/2014 at 1:38 pm to Dr. Nelva Andrews , who verbally acknowledged these results.   Electronically Signed   By: Casey Andrews M.D.   On: 10/11/2014 13:39   Dg Chest Port 1 View  10/12/2014   CLINICAL DATA:  Fall initial encounter  EXAM: PORTABLE CHEST - 1 VIEW  COMPARISON:  10/11/2014  FINDINGS: The heart size and mediastinal contours are within normal limits. Both lungs are clear. The visualized skeletal structures are unremarkable.  IMPRESSION: No active disease.   Electronically Signed   By: Casey Andrews M.D.   On: 10/12/2014 07:43    Microbiology: Recent Results (from the past 240 hour(s))  MRSA PCR Screening     Status: None   Collection Time: 10/11/14  6:48 PM  Result Value Ref Range Status   MRSA by PCR NEGATIVE NEGATIVE Final    Comment:        The GeneXpert MRSA Assay (FDA approved for NASAL specimens only), is one component of a comprehensive MRSA colonization surveillance program. It is not intended to diagnose MRSA infection nor to guide or monitor treatment for MRSA infections.      Labs: Basic Metabolic Panel:  Recent Labs Lab 10/11/14 1421  NA 137  K 3.5  CL 104  GLUCOSE 97  BUN 13  CREATININE 0.80   Liver Function Tests: No results for input(s): AST, ALT,  ALKPHOS, BILITOT, PROT, ALBUMIN in the last 168 hours. No results for input(s): LIPASE, AMYLASE in the last 168 hours. No results for input(s): AMMONIA in the last 168 hours. CBC:  Recent Labs Lab 10/11/14 1421 10/11/14 1516 10/12/14 0635 10/12/14 1442  WBC  --  9.4 8.3 7.8  HGB 15.0 13.8 14.3 14.2  HCT 44.0 40.0 41.9 41.5  MCV  --  90.1 91.7 91.6  PLT  --  191 190 195   Cardiac Enzymes: No results for input(s): CKTOTAL, CKMB, CKMBINDEX, TROPONINI in the last 168 hours. BNP: BNP (last 3 results) No results for input(s): BNP in the last 8760 hours.  ProBNP (last 3 results) No results for input(s): PROBNP in the last 8760 hours.  CBG: No results for input(s): GLUCAP in the last 168 hours.  Active Problems:   Liver laceration   Time coordinating discharge: <30 mins  Signed:  Emina Riebock, ANP-BC

## 2014-10-12 NOTE — Progress Notes (Signed)
Pt ambulated approximately 200 feet in hall without difficulty.

## 2014-10-12 NOTE — Progress Notes (Signed)
Patient ID: Casey Andrews,Casey BazarDOB: Sep 27, 1985, 29 y.o.   MRN: [id number]  LOS: 1 day   Subjective: No n/v.  Passing flatus.  6/10 pain without meds.  Voiding.  On bedrest.   Objective: Vital signs in last 24 hours: Temp:  [97.5 F (36.4 C)-98.1 F (36.7 C)] 97.8 F (36.6 C) (09/19 0449) Pulse Rate:  [54-84] 67 (09/19 0449) Resp:  [15-20] 17 (09/19 0449) BP: (115-155)/(59-91) 121/65 mmHg (09/19 0449) SpO2:  [96 %-100 %] 96 % (09/19 0449) Weight:  [111.8 kg (246 lb 7.6 oz)] 111.8 kg (246 lb 7.6 oz) (09/18 1900) Last BM Date: 10/11/14  Lab Results:  CBC  Recent Labs  10/11/14 1516 10/12/14 0635  WBC 9.4 8.3  HGB 13.8 14.3  HCT 40.0 41.9  PLT 191 190   BMET  Recent Labs  10/11/14 1421  NA 137  K 3.5  CL 104  GLUCOSE 97  BUN 13  CREATININE 0.80    Imaging: Dg Ribs Unilateral W/chest Right  10/11/2014   CLINICAL DATA:  Fall from ladder today  EXAM: RIGHT RIBS AND CHEST - 3+ VIEW  COMPARISON:  02/02/2006  FINDINGS: Five views right ribs submitted. No acute infiltrate or pulmonary edema. No rib fracture. No pneumothorax.  IMPRESSION: Negative.   Electronically Signed   By: Natasha Mead M.D.   On: 10/11/2014 11:27   Ct Abdomen Pelvis W Contrast  10/11/2014   CLINICAL DATA:  Right rib pain , status post fall from ladder  EXAM: CT ABDOMEN AND PELVIS WITH CONTRAST  TECHNIQUE: Multidetector CT imaging of the abdomen and pelvis was performed using the standard protocol following bolus administration of intravenous contrast.  CONTRAST:  OMNIPAQUE IOHEXOL 300 MG/ML  SOLN  COMPARISON:  Right ribs same day  FINDINGS: No lower rib fractures are noted. Sagittal images of the spine are unremarkable. No acute fractures are noted. No pelvic fractures are seen.  Enhanced liver shows small laceration anterior aspect of the right hepatic lobe axial image 15 measures about 2 cm. Small amount of perihepatic hemorrhagic ascites. No large hematoma is noted.  The pancreas, spleen and adrenal  glands are un- kidneys are symmetrical in size and enhancement. No hydronephrosis or hydroureter. No evidence of renal laceration. Abdominal aorta is unremarkable.  No small bowel obstruction. No ascites or free air. No adenopathy. No thickened or dilated small bowel loops.  No aortic aneurysm. There is no pericecal inflammation. Normal appendix. The terminal ileum is unremarkable.  No evidence of urinary bladder injury. Prostate gland and seminal vesicles are unremarkable. No inguinal adenopathy. Minimal degenerative changes bilateral SI joints.  Delayed renal images shows bilateral renal symmetrical excretion. Bilateral visualized proximal ureter is unremarkable. Coronal images shows unremarkable bilateral hip joints.  IMPRESSION: 1. Small amount of perihepatic hemorrhagic ascites. Small focal laceration in right hepatic lobe anteriorly axial image 15 measures 2 cm. Minimal extravasation/hemorrhage at the site of laceration axial image 16. 2. No hydronephrosis or hydroureter.  No renal laceration. 3. No evidence of urinary bladder injury. 4. No pericecal inflammation.  Normal appendix. 5. No acute fractures are noted within abdomen and pelvis. These results were called by telephone at the time of interpretation on 10/11/2014 at 1:38 pm to Dr. Nelva Nay , who verbally acknowledged these results.   Electronically Signed   By: Natasha Mead M.D.   On: 10/11/2014 13:39     PE: General appearance: alert, cooperative and no distress Resp: clear to auscultation bilaterally Cardio: regular rate and  rhythm, S1, S2 normal, no murmur, click, rub or gallop GI: +bs, abdomen is soft, TTP to RUQ.  Extremities: extremities normal, atraumatic, no cyanosis or edema      Patient Active Problem List   Diagnosis Date Noted  . Liver laceration 10/11/2014  . Incarcerated right inguinal hernia 04/21/2014    Assessment/Plan: Fall Liver laceration-mobilize, check CBC later today   VTE - SCD's FEN - adv diet Dispo  -- possible DC later today.    Ashok Norris, ANP-BC Pager: [phone] General Trauma [place] Pager: [phone]   10/12/2014 7:23 AM

## 2014-10-12 NOTE — Clinical Social Work Note (Signed)
BSW intern signing off. No further social work needs identified. Please reconsult if new needs arise prior to discharge.   Jenita Seashore, BSW Intern

## 2015-02-08 ENCOUNTER — Inpatient Hospital Stay
Admit: 2015-02-08 | Discharge: 2015-02-08 | Disposition: A | Discharge status: Home / Self Care | Payer: Self-pay | Attending: Emergency Medicine

## 2015-02-08 ENCOUNTER — Emergency Department: Admit: 2015-02-08 | Payer: Self-pay | Primary: Family Medicine

## 2015-02-08 DIAGNOSIS — S9031XA Contusion of right foot, initial encounter: Secondary | ICD-10-CM

## 2015-02-08 MED ORDER — IBUPROFEN 800 MG TAB
800 mg | ORAL_TABLET | Freq: Three times a day (TID) | ORAL | 0 refills | Status: AC | PRN
Start: 2015-02-08 — End: 2015-02-18

## 2015-02-08 NOTE — ED Provider Notes (Signed)
HPI Comments: Patient states 25 pound weight weight fell onto his right foot on Saturday.  He was at his home.  Continue pain today.  Little relief with ice and elevation    Patient is a 30 y.o. male presenting with foot pain. The history is provided by the patient.   Foot Pain    This is a new problem. The current episode started 2 days ago. The problem occurs constantly. The problem has not changed since onset.The pain is present in the right foot. The quality of the pain is described as aching. The pain is at a severity of 7/10. The pain is moderate. Pertinent negatives include no numbness and no tingling. The symptoms are aggravated by activity and palpation. He has tried cold and rest for the symptoms. The treatment provided no relief. There has been a history of trauma.        History reviewed. No pertinent past medical history.    History reviewed. No pertinent past surgical history.      History reviewed. No pertinent family history.    Social History     Social History   ??? Marital status: SINGLE     Spouse name: N/A   ??? Number of children: N/A   ??? Years of education: N/A     Occupational History   ??? Not on file.     Social History Main Topics   ??? Smoking status: Current Every Day Smoker   ??? Smokeless tobacco: Not on file   ??? Alcohol use Not on file   ??? Drug use: Not on file   ??? Sexual activity: Not on file     Other Topics Concern   ??? Not on file     Social History Narrative   ??? No narrative on file         ALLERGIES: Review of patient's allergies indicates no known allergies.    Review of Systems   Neurological: Negative for tingling and numbness.   All other systems reviewed and are negative.      Vitals:    02/08/15 1048   BP: 136/74   Pulse: 75   Resp: 18   Temp: 98.1 ??F (36.7 ??C)   SpO2: 97%   Weight: 111.1 kg (245 lb)   Height:  (1.803 m)            Physical Exam   Constitutional: He is oriented to person, place, and time. He appears well-developed and well-nourished. No distress.   HENT:    Head: Normocephalic and atraumatic.   Right Ear: External ear normal.   Left Ear: External ear normal.   Eyes: Conjunctivae and EOM are normal. Pupils are equal, round, and reactive to light.   Neck: Normal range of motion. Neck supple.   Cardiovascular: Normal rate, regular rhythm and normal heart sounds.    Pulmonary/Chest: Effort normal and breath sounds normal. No respiratory distress. He has no wheezes.   Abdominal: Soft. Bowel sounds are normal.   Musculoskeletal: He exhibits tenderness. He exhibits no edema.   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  Pain across the midfoot area to palpation no obvious bruising noted no pain to the toes full ankle motion   Neurological: He is alert and oriented to person, place, and time.   Skin: Skin is warm and dry.   Psychiatric: He has a normal mood and affect.   Nursing note and vitals reviewed.       MDM  Number of Diagnoses or Management Options  Diagnosis management comments: No fracture noted, incidental finding of foreign body at the great toe is noted.  Patient denies any obvious recent injury.  There is no signs of any infection, will give motrin  for contusion       Amount and/or Complexity of Data Reviewed  Tests in the radiology section of CPT??: ordered and reviewed    Risk of Complications, Morbidity, and/or Mortality  Presenting problems: moderate  Diagnostic procedures: low  Management options: low    Patient Progress  Patient progress: improved    ED Course       Procedures

## 2015-02-08 NOTE — ED Triage Notes (Signed)
Dropped a weight on right foot 2 days ago.

## 2015-02-08 NOTE — ED Notes (Signed)
I have reviewed discharge instructions with the patient.  The patient verbalized understanding.

## 2015-05-13 ENCOUNTER — Emergency Department: Admit: 2015-05-13 | Payer: Self-pay | Primary: Family Medicine

## 2015-05-13 ENCOUNTER — Inpatient Hospital Stay
Admit: 2015-05-13 | Discharge: 2015-05-13 | Disposition: A | Discharge status: Home / Self Care | Payer: Self-pay | Attending: Student in an Organized Health Care Education/Training Program

## 2015-05-13 DIAGNOSIS — S20212A Contusion of left front wall of thorax, initial encounter: Secondary | ICD-10-CM

## 2015-05-13 MED ORDER — ACETAMINOPHEN-CODEINE 300 MG-30 MG TAB
300-30 mg | ORAL_TABLET | ORAL | 0 refills | Status: AC | PRN
Start: 2015-05-13 — End: ?

## 2015-05-13 MED ORDER — CYCLOBENZAPRINE 5 MG TAB
5 mg | ORAL_TABLET | Freq: Three times a day (TID) | ORAL | 0 refills | Status: AC | PRN
Start: 2015-05-13 — End: ?

## 2015-05-13 MED ORDER — HYDROCODONE-ACETAMINOPHEN 5 MG-325 MG TAB
5-325 mg | Freq: Once | ORAL | Status: AC
Start: 2015-05-13 — End: 2015-05-13
  Administered 2015-05-13: 20:00:00 via ORAL

## 2015-05-13 MED FILL — HYDROCODONE-ACETAMINOPHEN 5 MG-325 MG TAB: 5-325 mg | ORAL | Qty: 1

## 2015-05-13 NOTE — ED Provider Notes (Addendum)
HPI Comments: 30 y/o m to ed with left lateral rib pain after struck with baseball while batting yesterday.  Diff to breathe due to pain.  Worse with palpation.  Baseball sized purple contusion visible to region    Patient is a 30 y.o. male presenting with rib pain. The history is provided by the patient and the spouse. No language interpreter was used.   Rib Pain   The current episode started yesterday. The problem has been gradually worsening. Pertinent negatives include no fever, no headaches, no coryza, no rhinorrhea, no sore throat, no swollen glands, no ear pain, no neck pain, no cough, no sputum production, no hemoptysis, no wheezing, no PND, no orthopnea, no chest pain, no syncope, no vomiting, no abdominal pain, no rash, no leg pain, no leg swelling and no claudication.        History reviewed. No pertinent past medical history.    History reviewed. No pertinent surgical history.      History reviewed. No pertinent family history.    Social History     Social History   ??? Marital status: SINGLE     Spouse name: N/A   ??? Number of children: N/A   ??? Years of education: N/A     Occupational History   ??? Not on file.     Social History Main Topics   ??? Smoking status: Current Every Day Smoker   ??? Smokeless tobacco: Not on file   ??? Alcohol use Not on file   ??? Drug use: Not on file   ??? Sexual activity: Not on file     Other Topics Concern   ??? Not on file     Social History Narrative         ALLERGIES: Review of patient's allergies indicates no known allergies.    Review of Systems   Constitutional: Negative for chills and fever.   HENT: Negative for ear pain, mouth sores, rhinorrhea and sore throat.    Eyes: Negative for discharge and redness.   Respiratory: Positive for shortness of breath (cant take deep breath). Negative for cough, hemoptysis, sputum production and wheezing.    Cardiovascular: Negative for chest pain, orthopnea, claudication, leg swelling, syncope and PND.    Gastrointestinal: Negative for abdominal pain and vomiting.   Endocrine: Negative for cold intolerance and heat intolerance.   Genitourinary: Negative for difficulty urinating and dysuria.   Musculoskeletal: Negative for back pain and neck pain.   Skin: Positive for color change. Negative for rash.   Neurological: Negative for dizziness, facial asymmetry and headaches.   Psychiatric/Behavioral: Negative for confusion and decreased concentration.       Vitals:    05/13/15 1510   BP: 146/85   Pulse: 96   Resp: 18   Temp: 98 ??F (36.7 ??C)   SpO2: 98%   Weight: 107 kg (236 lb)   Height: 6' (1.829 m)            Physical Exam   Constitutional: He is oriented to person, place, and time. He appears well-developed and well-nourished. No distress.   HENT:   Head: Normocephalic and atraumatic.   Right Ear: External ear normal.   Left Ear: External ear normal.   Nose: Nose normal.   Eyes: Conjunctivae and EOM are normal. Pupils are equal, round, and reactive to light.   Neck: Normal range of motion. Neck supple.   Cardiovascular: Normal rate, regular rhythm and normal heart sounds.    Pulmonary/Chest: Effort normal. No respiratory  distress. He has no wheezes. He exhibits tenderness.   Breath sounds diminished   Abdominal: Soft. Bowel sounds are normal. He exhibits no distension. There is no tenderness.       Musculoskeletal: Normal range of motion. He exhibits tenderness. He exhibits no edema.   Neurological: He is alert and oriented to person, place, and time. No cranial nerve deficit. Coordination normal.   Skin: Skin is warm and dry. No rash noted.   Psychiatric: He has a normal mood and affect. His behavior is normal. Judgment and thought content normal.   Nursing note and vitals reviewed.       MDM  Number of Diagnoses or Management Options  Diagnosis management comments: 30 y/o m to ed with baseball shaped contusion left chest after playing baseball yesterday and struck while  batting.  Will provide pain control, eval cxr.  Sat 98%  5:00 PM  No fsx on xray.  Will dc home w pain control.  Instructed in importance of cough and deep breathing       Amount and/or Complexity of Data Reviewed  Tests in the radiology section of CPT??: ordered and reviewed    Risk of Complications, Morbidity, and/or Mortality  Presenting problems: minimal  Diagnostic procedures: minimal  Management options: minimal    Patient Progress  Patient progress: stable    ED Course       Procedures

## 2015-05-13 NOTE — ED Notes (Signed)
I have reviewed discharge instructions with the patient.  The patient verbalized understanding.

## 2015-05-13 NOTE — ED Triage Notes (Signed)
Pt arrive c/o left rib cage pain after being hit with a baseball while playing. Denies sob, only due to pain.

## 2016-04-11 IMAGING — US US SCROTUM
1 series · 13 of 25 positions shown · non-contrast
Comparison: 04/27/2012.

CLINICAL DATA: 28-year-old male with right testicular pain and
swelling. Personal history of right hernia repair. Initial
encounter.

EXAM:
SCROTAL ULTRASOUND
DOPPLER ULTRASOUND OF THE TESTICLES
TECHNIQUE: Complete ultrasound examination of the testicles, epididymis, and
other scrotal structures was performed. Color and spectral Doppler
ultrasound were also utilized to evaluate blood flow to the
testicles.

[Series 1: us scrotum · 0.15mm/px · 37 acquisitions, 13 frames shown]
[im 1/37]
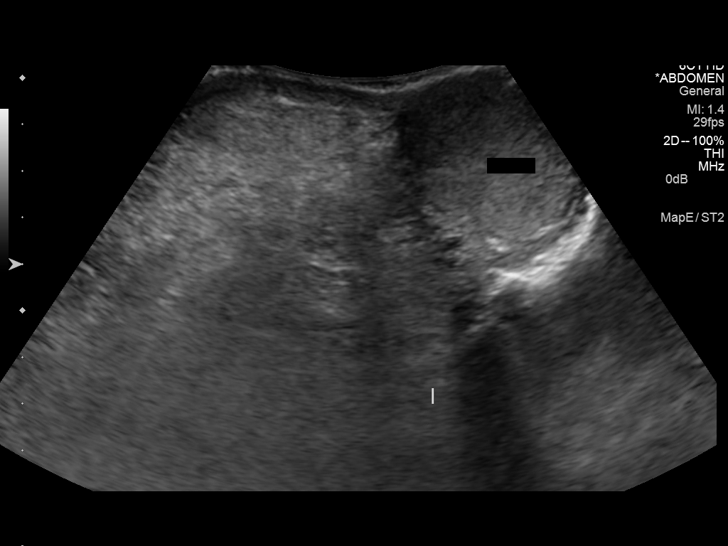
[im 4/37]
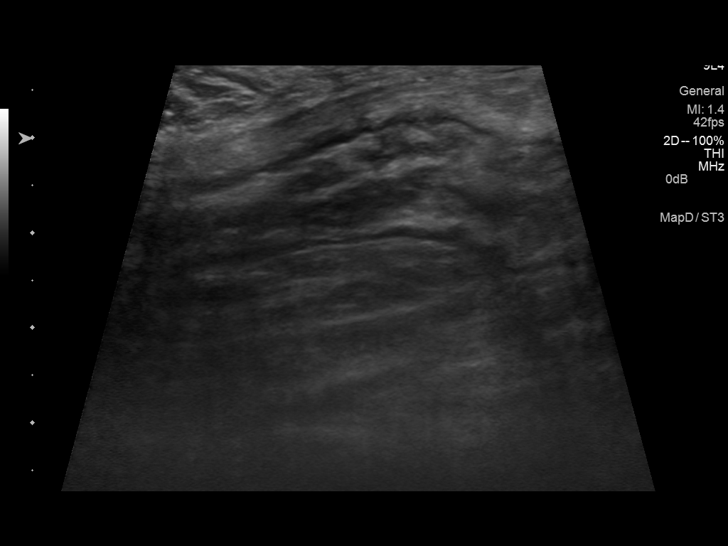
[im 7/37]
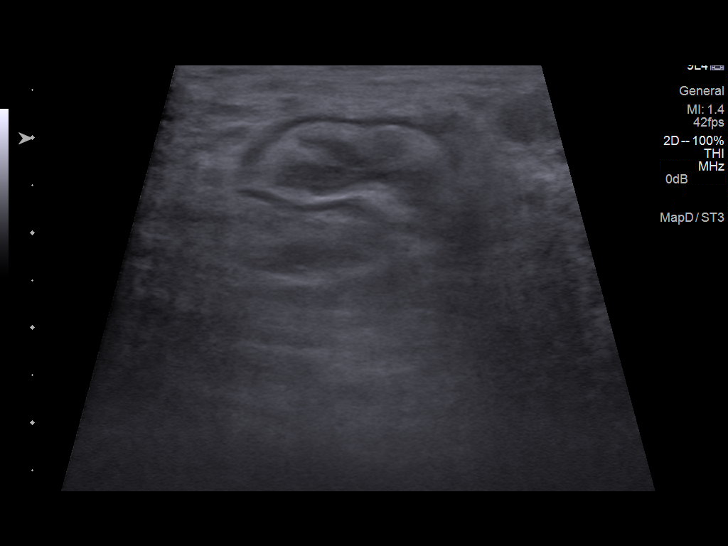
[im 10/37]
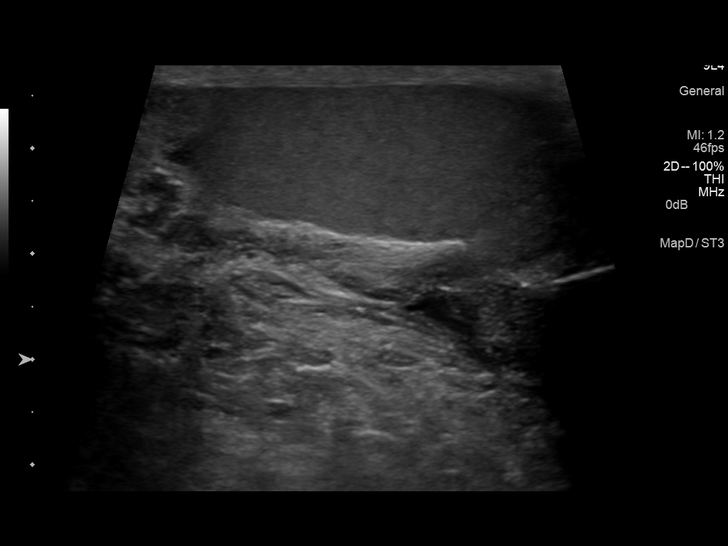
[im 13/37]
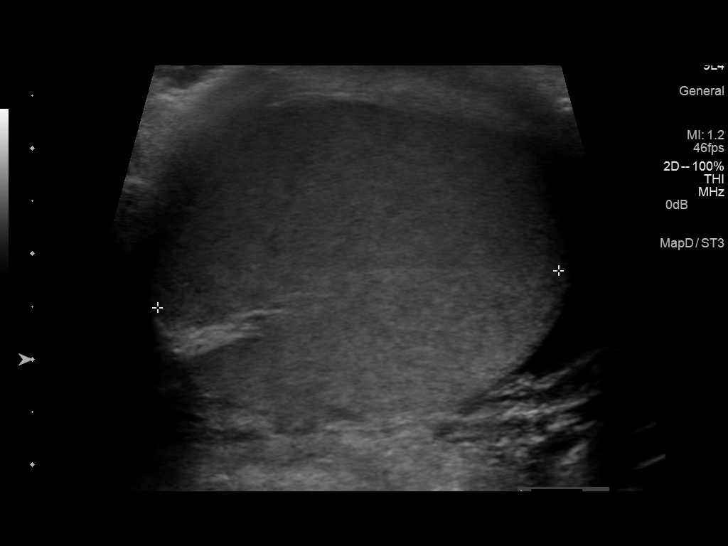
[im 16/37]
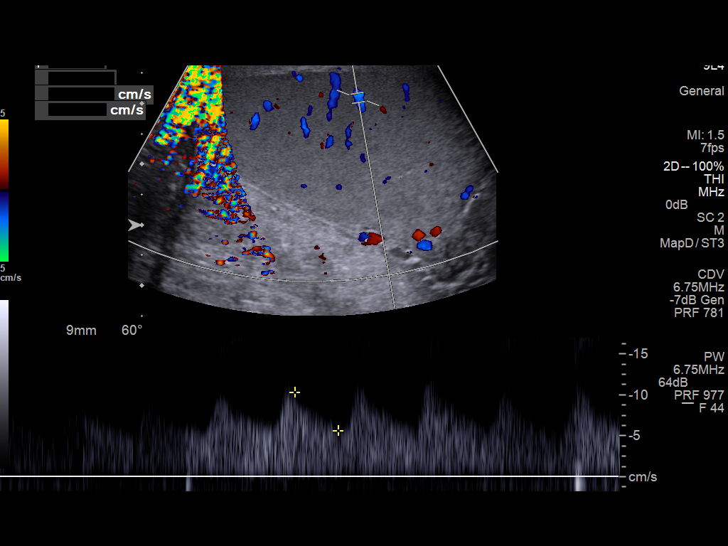
[im 19/37]
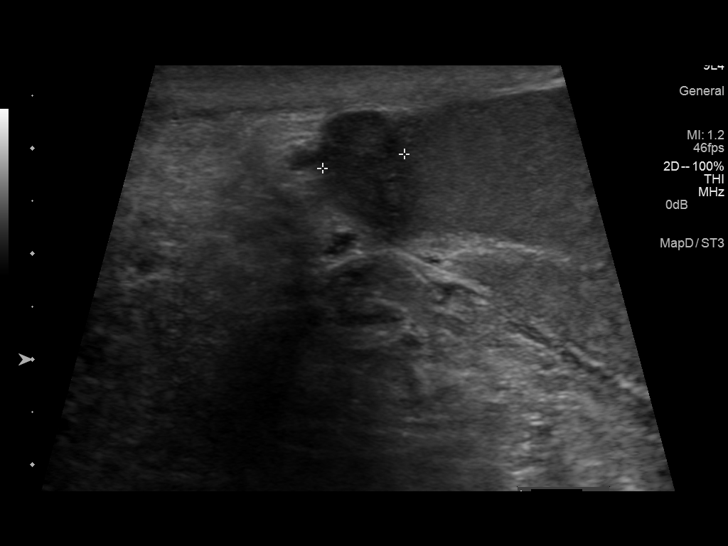
[im 22/37]
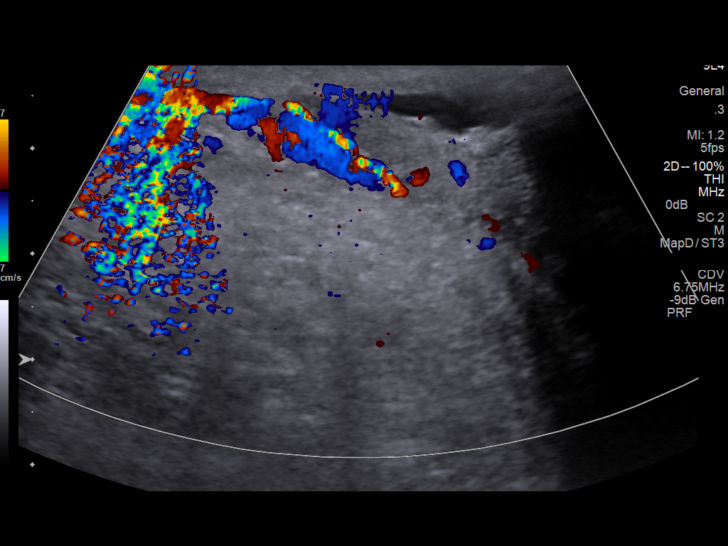
[im 25/37]
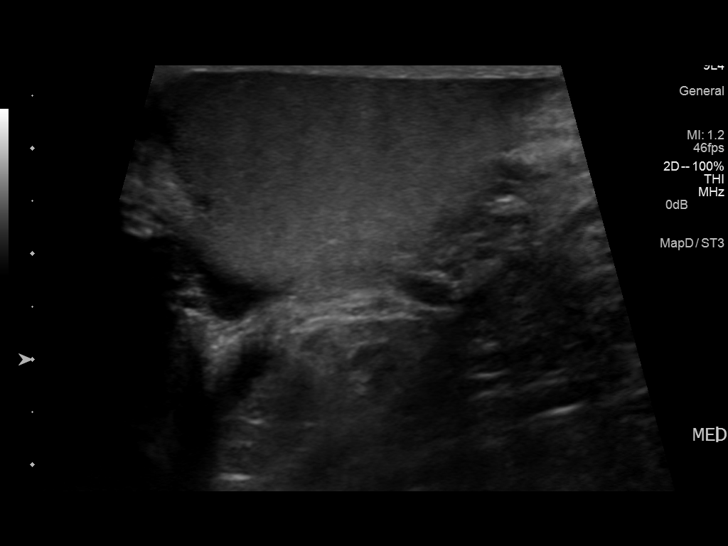
[im 28/37]
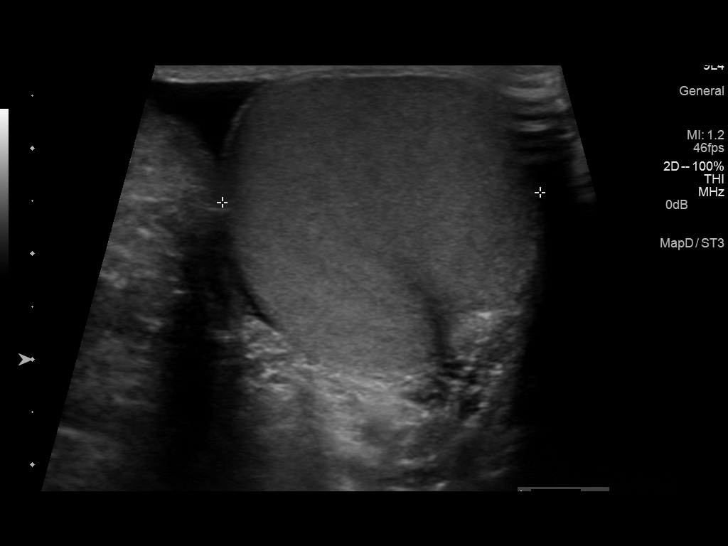
[im 31/37]
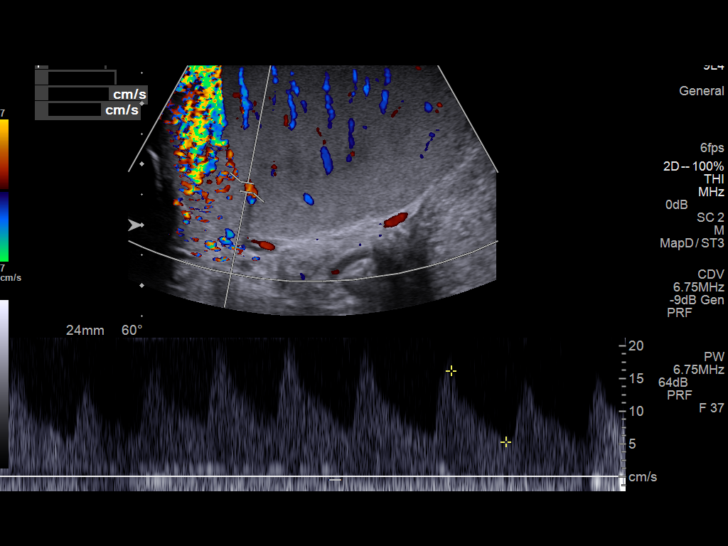
[im 34/37]
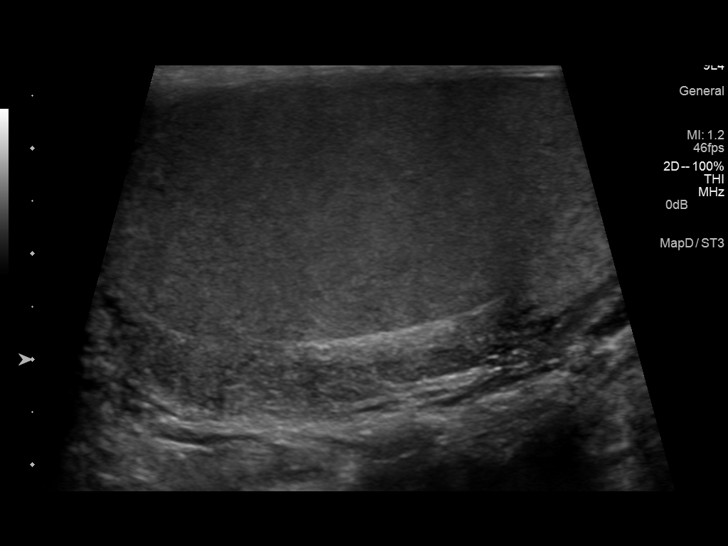
[im 37/37]
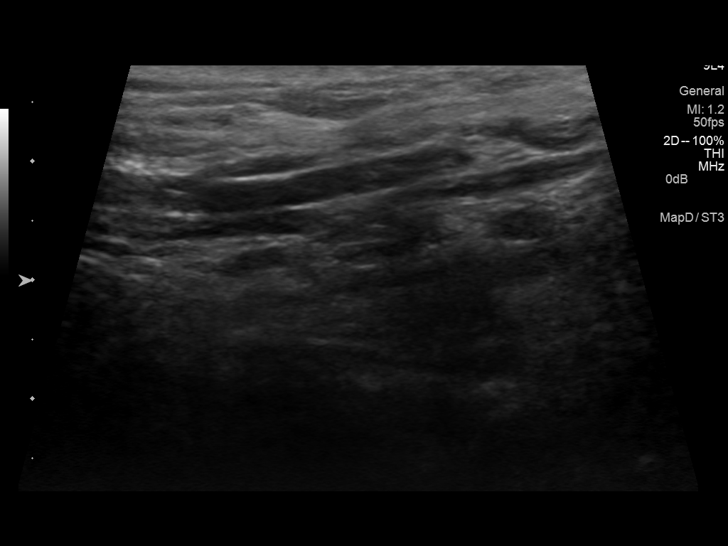

[13 of 25 positions shown; findings below may reference images not displayed]

FINDINGS: Right testicle

Measurements: 4.8 x 2.8 x 3.8 cm. No mass or microlithiasis
visualized.

Left testicle

Measurements: 5.0 x 2.8 x 3.0 cm. No mass or microlithiasis
visualized.

Right epididymis:  Normal in size and appearance.

Left epididymis:  Normal in size and appearance.

Hydrocele:  None visualized.

Varicocele:  None visualized.

Pulsed Doppler interrogation of both testes demonstrates normal low
resistance arterial and venous waveforms bilaterally.

Other findings: Asymmetric soft tissue in the right inguinal canal
displacing the right testicle inferiorly, similar to that depicted
in 8416. This has gut signature on cine images (image 8 series 2 and
image 7 series 1).
IMPRESSION: 1. Evidence of bowel containing right inguinal hernia.
2. Otherwise negative scrotal ultrasound.  No testicular torsion.

## 2016-10-02 IMAGING — CR DG CHEST 1V PORT
1 series · 1 of 1 positions shown · non-contrast
Comparison: 10/11/2014

CLINICAL DATA: Fall initial encounter

EXAM:
PORTABLE CHEST - 1 VIEW

[AP]
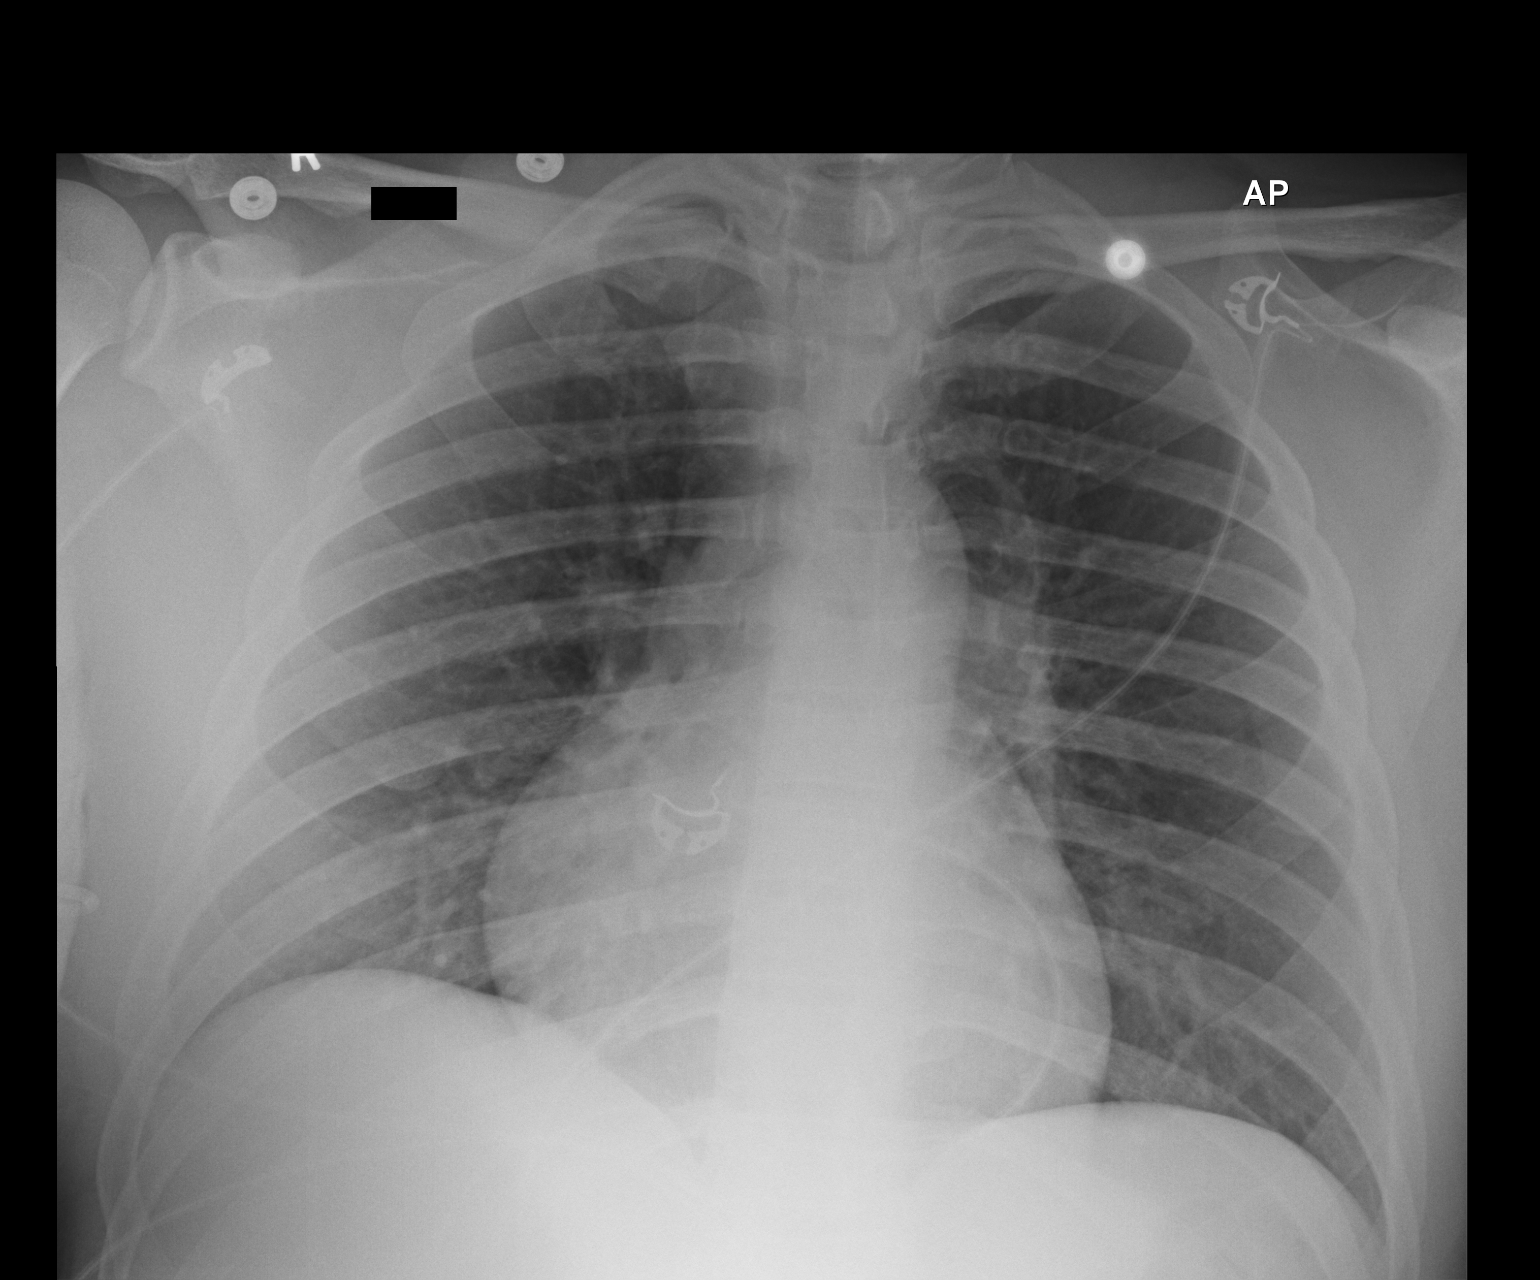

[1 of 1 positions shown; findings below may reference images not displayed]

FINDINGS: The heart size and mediastinal contours are within normal limits.
Both lungs are clear. The visualized skeletal structures are
unremarkable.
IMPRESSION: No active disease.
